# Patient Record
Sex: Male | Born: 1980
Health system: Southern US, Community
[De-identification: ages and names within clinical notes are randomized; demographics above are authoritative.]

## PROBLEM LIST (undated history)

## (undated) DIAGNOSIS — E7800 Pure hypercholesterolemia, unspecified: Secondary | ICD-10-CM

## (undated) DIAGNOSIS — I10 Essential (primary) hypertension: Secondary | ICD-10-CM

## (undated) DIAGNOSIS — E079 Disorder of thyroid, unspecified: Secondary | ICD-10-CM

## (undated) HISTORY — PX: WISDOM TOOTH EXTRACTION: SHX21

---

## 2013-02-15 ENCOUNTER — Encounter (HOSPITAL_COMMUNITY): Payer: Self-pay | Admitting: Family Medicine

## 2013-02-15 ENCOUNTER — Emergency Department (HOSPITAL_COMMUNITY)
Admission: EM | Admit: 2013-02-15 | Discharge: 2013-02-15 | Disposition: A | Discharge status: Home / Self Care | Payer: Self-pay | Attending: Emergency Medicine | Admitting: Emergency Medicine

## 2013-02-15 DIAGNOSIS — Y929 Unspecified place or not applicable: Secondary | ICD-10-CM | POA: Insufficient documentation

## 2013-02-15 DIAGNOSIS — Z23 Encounter for immunization: Secondary | ICD-10-CM | POA: Insufficient documentation

## 2013-02-15 DIAGNOSIS — W260XXA Contact with knife, initial encounter: Secondary | ICD-10-CM | POA: Insufficient documentation

## 2013-02-15 DIAGNOSIS — Y93G9 Activity, other involving cooking and grilling: Secondary | ICD-10-CM | POA: Insufficient documentation

## 2013-02-15 DIAGNOSIS — S61412A Laceration without foreign body of left hand, initial encounter: Secondary | ICD-10-CM

## 2013-02-15 DIAGNOSIS — S61409A Unspecified open wound of unspecified hand, initial encounter: Secondary | ICD-10-CM | POA: Insufficient documentation

## 2013-02-15 DIAGNOSIS — F17200 Nicotine dependence, unspecified, uncomplicated: Secondary | ICD-10-CM | POA: Insufficient documentation

## 2013-02-15 MED ORDER — TETANUS-DIPHTH-ACELL PERTUSSIS 5-2.5-18.5 LF-MCG/0.5 IM SUSP
0.5000 mL | Freq: Once | INTRAMUSCULAR | Status: AC
  Administered 2013-02-15: 0.5 mL via INTRAMUSCULAR
  Filled 2013-02-15: qty 0.5

## 2013-02-15 NOTE — ED Notes (Signed)
Dr. Rulon Abide at bedside to suture laceration.

## 2013-02-15 NOTE — ED Notes (Addendum)
Patient states that he cut his hand on a knife while trying to make a sandwich. Laceration noted to 2nd digit of left hand; bleeding controlled.

## 2013-02-15 NOTE — ED Provider Notes (Signed)
History     CSN: [id number]  Arrival date & time 02/15/13  4098   First MD Initiated Contact with Patient 02/15/13 0423      Chief Complaint  Patient presents with  . Extremity Laceration   HPI Raymond Gomez is a 32 y.o. male a laceration to the left all that begins at the angle of the radial aspect of the second digit extends over the palm or surface about 4 cm. Patient was making a breaded chicken sandwich with mozzarella and mayonnaise when he was cutting bread with a serrated knife and cut into his hand.  Patient's pain at this point is minimal, it is nonradiating, sharp, exacerbated by touching the area. Patient says he is unsure of his last tetanus shot  History reviewed. No pertinent past medical history.  Past Surgical History  Procedure Laterality Date  . Wisdom tooth extraction      No family history on file.  History  Substance Use Topics  . Smoking status: Current Some Day Smoker -- 0.25 packs/day for 25 years  . Smokeless tobacco: Not on file  . Alcohol Use: Yes     Comment: 10-15 drinks per week      Review of Systems At least 10pt or greater review of systems completed and are negative except where specified in the HPI.  Allergies  Review of patient's allergies indicates no known allergies.  Home Medications   Current Outpatient Rx  Name  Route  Sig  Dispense  Refill  . cetirizine (ZYRTEC) 10 MG tablet   Oral   Take 10 mg by mouth daily as needed for allergies.           BP 161/105  Pulse 100  Temp(Src) 98.6 F (37 C) (Oral)  Resp 20  SpO2 97%  Physical Exam  Nursing notes reviewed.  Electronic medical record reviewed. VITAL SIGNS:   Filed Vitals:   02/15/13 0319  BP: 161/105  Pulse: 100  Temp: 98.6 F (37 C)  TempSrc: Oral  Resp: 20  SpO2: 97%   CONSTITUTIONAL: Awake, oriented, appears non-toxic HENT: Atraumatic, normocephalic, oral mucosa pink and moist, airway patent. Nares patent without drainage. External ears  normal. EYES: Conjunctiva clear, EOMI, PERRLA NECK: Trachea midline, non-tender, supple CARDIOVASCULAR: Normal heart rate, Normal rhythm, No murmurs, rubs, gallops PULMONARY/CHEST: Clear to auscultation, no rhonchi, wheezes, or rales. Symmetrical breath sounds. Non-tender. ABDOMINAL: Non-distended, soft, non-tender - no rebound or guarding.  BS normal. NEUROLOGIC: Non-focal, moving all four extremities, no gross sensory or motor deficits. EXTREMITIES: No clubbing, cyanosis, or edema. Laceration to the palmar aspect of the left hand extending from the radial aspect of the second digit across the palm just proximal to the MCP. Fingers appear normal. No TTP over flexor sheath. Radial/Ulnar arteries 2+ with <2sec cap refill. SILT in M/U/R distributions. Dynamic 2-pt discrimination intact over median and ulnar nerve distributions of the pointer finger. Grip 5/5 strength. MCP flexion/extension intact. Finger adduction/abduction intact with 5/5 strength. Full ROM to Flexion/Extension at wrist, MCP, PIP and DIP.  FDS/FDP intact tested independently by isolating specific tendon sheath SKIN: Warm, Dry, No erythema, No rash  ED Course  LACERATION REPAIR Date/Time: 02/15/2013 5:00 AM Performed by: Jones Skene Authorized by: Jones Skene Consent: Verbal consent obtained. Consent given by: patient Patient identity confirmed: verbally with patient Body area: upper extremity (Left palm) Laceration length: 4 cm Foreign bodies: no foreign bodies Tendon involvement: none (Tourniquet applied to the patient's wrist, wound examined under bloodless field, no flexor  tendon involvement) Nerve involvement: none Vascular damage: no Anesthesia: local infiltration Local anesthetic: lidocaine 1% without epinephrine Anesthetic total (ml): 6. Patient sedated: no Preparation: Patient was prepped and draped in the usual sterile fashion. Irrigation solution: saline Irrigation method: syringe (Using 18-gauge  Angiocath) Debridement: none Degree of undermining: none Skin closure: 4-0 nylon Number of sutures: 6 Technique: simple Approximation: close Approximation difficulty: simple Dressing: 4x4 sterile gauze Patient tolerance: Patient tolerated the procedure well with no immediate complications.   (including critical care time)  Labs Reviewed - No data to display No results found.   1. Laceration of palm without complication, left, initial encounter       MDM  Raymond Gomez is a 32 y.o. male presenting with a palmar laceration overlying the flexor tendons second digit of the left hand, Patient is neurovascularly intact.  Repair of laceration uneventfully, patient is neurovascularly intact following procedure.  Patient's tetanus shot was updated, this is not a dirty wound, was not contaminated, it was well irrigated do not think patient will require antibiotics at this time. He is given return precautions for infectious signs at the site to the emergency department, he's told to return in about 9 days to urgent care for suture removal. Patient does not currently have a primary care physician.     Jones Skene, MD 02/15/13 1027

## 2013-02-15 NOTE — ED Notes (Signed)
Bonk, MD at bedside. 

## 2014-09-06 ENCOUNTER — Other Ambulatory Visit: Payer: Self-pay | Admitting: Family Medicine

## 2014-09-06 DIAGNOSIS — K760 Fatty (change of) liver, not elsewhere classified: Secondary | ICD-10-CM

## 2014-09-17 ENCOUNTER — Other Ambulatory Visit: Payer: No Typology Code available for payment source

## 2015-12-19 ENCOUNTER — Emergency Department (HOSPITAL_COMMUNITY)
Admission: EM | Admit: 2015-12-19 | Discharge: 2015-12-20 | Disposition: A | Discharge status: Home / Self Care | Payer: Self-pay | Attending: Emergency Medicine | Admitting: Emergency Medicine

## 2015-12-19 ENCOUNTER — Encounter (HOSPITAL_COMMUNITY): Payer: Self-pay | Admitting: *Deleted

## 2015-12-19 ENCOUNTER — Emergency Department (HOSPITAL_COMMUNITY): Payer: Self-pay

## 2015-12-19 DIAGNOSIS — R202 Paresthesia of skin: Secondary | ICD-10-CM | POA: Insufficient documentation

## 2015-12-19 DIAGNOSIS — I10 Essential (primary) hypertension: Secondary | ICD-10-CM | POA: Insufficient documentation

## 2015-12-19 HISTORY — DX: Pure hypercholesterolemia, unspecified: E78.00

## 2015-12-19 HISTORY — DX: Essential (primary) hypertension: I10

## 2015-12-19 HISTORY — DX: Disorder of thyroid, unspecified: E07.9

## 2015-12-19 LAB — I-STAT CHEM 8, ED
BUN: 8 mg/dL (ref 6–20)
CHLORIDE: 103 mmol/L (ref 101–111)
Calcium, Ion: 1.08 mmol/L — ABNORMAL LOW (ref 1.12–1.23)
Creatinine, Ser: 1.4 mg/dL — ABNORMAL HIGH (ref 0.61–1.24)
Glucose, Bld: 84 mg/dL (ref 65–99)
HCT: 51 % (ref 39.0–52.0)
HEMOGLOBIN: 17.3 g/dL — AB (ref 13.0–17.0)
POTASSIUM: 3.8 mmol/L (ref 3.5–5.1)
SODIUM: 145 mmol/L (ref 135–145)
TCO2: 28 mmol/L (ref 0–100)

## 2015-12-19 LAB — PROTIME-INR
INR: 1.05 (ref 0.00–1.49)
Prothrombin Time: 13.9 seconds (ref 11.6–15.2)

## 2015-12-19 LAB — DIFFERENTIAL
BASOS ABS: 0.1 10*3/uL (ref 0.0–0.1)
BASOS PCT: 1 %
Eosinophils Absolute: 0 10*3/uL (ref 0.0–0.7)
Eosinophils Relative: 0 %
Lymphocytes Relative: 39 %
Lymphs Abs: 4.1 10*3/uL — ABNORMAL HIGH (ref 0.7–4.0)
MONOS PCT: 8 %
Monocytes Absolute: 0.8 10*3/uL (ref 0.1–1.0)
NEUTROS ABS: 5.5 10*3/uL (ref 1.7–7.7)
NEUTROS PCT: 52 %

## 2015-12-19 LAB — I-STAT TROPONIN, ED: Troponin i, poc: 0 ng/mL (ref 0.00–0.08)

## 2015-12-19 LAB — CBG MONITORING, ED: Glucose-Capillary: 83 mg/dL (ref 65–99)

## 2015-12-19 LAB — CBC
HEMATOCRIT: 48 % (ref 39.0–52.0)
Hemoglobin: 16.2 g/dL (ref 13.0–17.0)
MCH: 33.1 pg (ref 26.0–34.0)
MCHC: 33.8 g/dL (ref 30.0–36.0)
MCV: 98.2 fL (ref 78.0–100.0)
PLATELETS: 218 10*3/uL (ref 150–400)
RBC: 4.89 MIL/uL (ref 4.22–5.81)
RDW: 14.7 % (ref 11.5–15.5)
WBC: 10.4 10*3/uL (ref 4.0–10.5)

## 2015-12-19 LAB — COMPREHENSIVE METABOLIC PANEL
ALBUMIN: 4.9 g/dL (ref 3.5–5.0)
ALT: 97 U/L — AB (ref 17–63)
AST: 92 U/L — AB (ref 15–41)
Alkaline Phosphatase: 82 U/L (ref 38–126)
Anion gap: 15 (ref 5–15)
BUN: 10 mg/dL (ref 6–20)
CHLORIDE: 104 mmol/L (ref 101–111)
CO2: 25 mmol/L (ref 22–32)
CREATININE: 0.9 mg/dL (ref 0.61–1.24)
Calcium: 9.4 mg/dL (ref 8.9–10.3)
GFR calc Af Amer: >60 mL/min (ref >60)
GFR calc non Af Amer: >60 mL/min (ref >60)
GLUCOSE: 84 mg/dL (ref 65–99)
Potassium: 3.9 mmol/L (ref 3.5–5.1)
SODIUM: 144 mmol/L (ref 135–145)
Total Bilirubin: 1.1 mg/dL (ref 0.3–1.2)
Total Protein: 8.8 g/dL — ABNORMAL HIGH (ref 6.5–8.1)

## 2015-12-19 LAB — APTT: APTT: 28 s (ref 24–37)

## 2015-12-19 NOTE — ED Notes (Signed)
Patient endorses history of anxiety and a heavy feeling on the chest, but denies pain. Patient states this is consistent with his anxiety and not an acute episode.

## 2015-12-19 NOTE — ED Notes (Signed)
Pt states that he is having left arm and hand numbness since this am; pt denies chest pain; pt with equal grips and strength to bilateral hands; pt able to use arm without difficulty; pt states that it feels like tingling all through his arm; pt + ETOH

## 2015-12-19 NOTE — ED Provider Notes (Signed)
CSN: [id number]     Arrival date & time 12/19/15  1906 History  By signing my name below, I, Tanda RockersMargaux Venter, attest that this documentation has been prepared under the direction and in the presence of Derwood KaplanAnkit Emie Sommerfeld, MD. Electronically Signed: Tanda RockersMargaux Venter, ED Scribe. 12/20/2015. 12:01 AM.   Chief Complaint  Patient presents with  . Numbness   The history is provided by the patient. No language interpreter was used.     HPI Comments: Raymond Gomez is a 35 y.o. male with PMHx HTN who presents to the Emergency Department complaining of constant, unchanged, diffuse left arm tingling that began yesterday morning. Pt describes the tingling as a pins and needle sensation. His last normal was last night prior to going to sleep. No recent head trauma. No hx cardiac issues or stroke. Pt estimates 0-15 beers per day but denies heavy drinking last night or waking up in a strange position. Denies numbness, slurred speech, chest pain, neck pain, or any other associated symptoms. Denies recent illicit drug use.  No recent tick bites. Pt is current smoker who only smokes when he drinks EtOH.  Past Medical History  Diagnosis Date  . Hypertension   . Hypercholesteremia   . Thyroid disease    Past Surgical History  Procedure Laterality Date  . Wisdom tooth extraction     No family history on file. Social History  Substance Use Topics  . Smoking status: Current Every Day Smoker -- 0.50 packs/day for 25 years    Types: Cigarettes  . Smokeless tobacco: None  . Alcohol Use: Yes     Comment: 10-15 drinks per week    Review of Systems  A complete 10 system review of systems was obtained and all systems are negative except as noted in the HPI and PMH.   Allergies  Review of patient's allergies indicates no known allergies.  Home Medications   Prior to Admission medications   Medication Sig Start Date End Date Taking? Authorizing Provider  atorvastatin (LIPITOR) 40 MG tablet Take 40 mg by  mouth daily. 10/21/15  Yes Historical Provider, MD  Cholecalciferol (VITAMIN D3) 5000 units CAPS Take 5,000 Units by mouth daily.   Yes Historical Provider, MD  diphenhydrAMINE (SOMINEX) 25 MG tablet Take 50 mg by mouth at bedtime as needed for itching or sleep (rash).   Yes Historical Provider, MD  levothyroxine (SYNTHROID, LEVOTHROID) 100 MCG tablet Take 100 mcg by mouth daily. 11/28/15  Yes Historical Provider, MD  metoprolol succinate (TOPROL-XL) 50 MG 24 hr tablet Take 50 mg by mouth daily. 12/15/15  Yes Historical Provider, MD  vitamin C (ASCORBIC ACID) 500 MG tablet Take 1,000 mg by mouth daily.   Yes Historical Provider, MD   BP 143/89 mmHg  Pulse 89  Temp(Src) 97.6 F (36.4 C) (Oral)  Resp 16  SpO2 96%   Physical Exam  Constitutional: He is oriented to person, place, and time. He appears well-developed and well-nourished. No distress.  HENT:  Head: Normocephalic and atraumatic.  Eyes: Conjunctivae and EOM are normal.  Neck: Neck supple. No tracheal deviation present.  No C spine point tenderness.   Cardiovascular: Regular rhythm.  Tachycardia present.   No murmur heard. Pulmonary/Chest: Effort normal and breath sounds normal. No respiratory distress. He has no wheezes. He has no rhonchi. He has no rales.  Musculoskeletal: Normal range of motion.  Neurological: He is alert and oriented to person, place, and time.  Subjective paresthesia and numbness to the LUE.  Motor strength  4+/5 and equal in upper and lower extremities.  Pt having difficultly discriminating between sharp and dull over LUE. The lack of sensation is worse distally.   Skin: Skin is warm and dry.  Psychiatric: He has a normal mood and affect. His behavior is normal.  Nursing note and vitals reviewed.   ED Course  Procedures (including critical care time)  DIAGNOSTIC STUDIES: Oxygen Saturation is 96% on RA, normal by my interpretation.    COORDINATION OF CARE: 11:56 PM-Discussed treatment plan which  includes transfer to [facility] for stroke ruleout with pt at bedside and pt agreed to plan.   Labs Review Labs Reviewed  DIFFERENTIAL - Abnormal; Notable for the following:    Lymphs Abs 4.1 (*)    All other components within normal limits  COMPREHENSIVE METABOLIC PANEL - Abnormal; Notable for the following:    Total Protein 8.8 (*)    AST 92 (*)    ALT 97 (*)    All other components within normal limits  I-STAT CHEM 8, ED - Abnormal; Notable for the following:    Creatinine, Ser 1.40 (*)    Calcium, Ion 1.08 (*)    Hemoglobin 17.3 (*)    All other components within normal limits  PROTIME-INR  APTT  CBC  I-STAT TROPOININ, ED  CBG MONITORING, ED    Imaging Review Ct Head Wo Contrast  12/19/2015  CLINICAL DATA:  Left arm and hand numbness since this morning, alcohol use EXAM: CT HEAD WITHOUT CONTRAST TECHNIQUE: Contiguous axial images were obtained from the base of the skull through the vertex without intravenous contrast. COMPARISON:  None. FINDINGS: Mild diffuse cortical atrophy which is nonetheless advanced for the age of the patient. No hydrocephalus. No evidence of vascular territory infarct or mass. No parenchymal hemorrhage or extra-axial fluid. Visualized portions of the paranasal sinuses are clear. Calvarium is intact. IMPRESSION: Atrophy, somewhat advanced for the patient's age. No acute findings. Electronically Signed   By: Esperanza Heir M.D.   On: 12/19/2015 20:24   I have personally reviewed and evaluated these images and lab results as part of my medical decision-making.   EKG Interpretation   Date/Time:  Monday December 19 2015 19:34:44 EDT Ventricular Rate:  98 PR Interval:  157 QRS Duration: 88 QT Interval:  332 QTC Calculation: 424 R Axis:   53 Text Interpretation:  Sinus rhythm normal intervals normal axis No acute  changes Confirmed by Rhunette Croft, MD, Shakeya Kerkman [phone]) on 12/20/2015 12:01:00 AM      MDM   Final diagnoses:  Paresthesia of left arm   I personally  performed the services described in this documentation, which was scribed in my presence. The recorded information has been reviewed and is accurate.  Pt comes in with cc of L sided paresthesias and numbness. Last normal last night.  Hx of heavy alcohol use, HTN, thyroid abnormalities. Uses marijuana now, and has used cocaine in the past. No weakness on the L side - but there is certainly numbness present. No neck pain, shoulder pain.  DDX: Stroke Brachial Plexopathy Cervical nerve impingement.  Pt will be transferred to [facility]. MRI ordered - if neg, he will f/u with Neuro for outpatient nerve conduction studies.     Derwood Kaplan, MD 12/20/15 [phone]

## 2015-12-20 ENCOUNTER — Emergency Department (HOSPITAL_COMMUNITY): Payer: Self-pay

## 2015-12-20 MED ORDER — LORAZEPAM 1 MG PO TABS
1.0000 mg | ORAL_TABLET | Freq: Once | ORAL | Status: AC
  Administered 2015-12-20: 1 mg via ORAL
  Filled 2015-12-20: qty 1

## 2015-12-20 MED ORDER — ACETAMINOPHEN 325 MG PO TABS
650.0000 mg | ORAL_TABLET | Freq: Once | ORAL | Status: AC
  Administered 2015-12-20: 650 mg via ORAL
  Filled 2015-12-20: qty 2

## 2015-12-20 MED ORDER — LORAZEPAM 2 MG/ML IJ SOLN
1.0000 mg | Freq: Once | INTRAMUSCULAR | Status: AC
  Administered 2015-12-20: 1 mg via INTRAVENOUS
  Filled 2015-12-20: qty 1

## 2015-12-20 NOTE — ED Provider Notes (Signed)
Pt stable in the ED Still awaiting MRI Signed out to dr little to f/u on MR imaging   Raymond Rhineonald Rochell Mabie, MD 12/20/15 [phone]

## 2015-12-20 NOTE — ED Notes (Signed)
[facility] per Dr. Rhunette CroftNanavati by private vehicle.

## 2015-12-20 NOTE — ED Notes (Signed)
Called MRI about patient. Stated that he is next on the list.

## 2015-12-20 NOTE — ED Notes (Signed)
RN called MRI regarding est time before patient goes for testing; tech states "just waiting on transport"; patient updated

## 2015-12-20 NOTE — ED Provider Notes (Signed)
Pt awake and alert, here for MRI.  [facility] Pt reports numbness in left arm. MRI pending at this time  Zadie Rhineonald Kadence Mikkelson, MD 12/20/15 [phone]

## 2015-12-20 NOTE — ED Provider Notes (Signed)
Care assumed from Dr. Clarene DukeLittle at 1600h plan for f/u MR for numbness symptom to r/o central etiology.  Results:  BP 177/95 mmHg  Pulse 108  Temp(Src) 98.9 F (37.2 C) (Oral)  Resp 20  Wt 255 lb (115.667 kg)  SpO2 97%  Results for orders placed or performed during the hospital encounter of 12/19/15  Protime-INR  Result Value Ref Range   Prothrombin Time 13.9 11.6 - 15.2 seconds   INR 1.05 0.00 - 1.49  APTT  Result Value Ref Range   aPTT 28 24 - 37 seconds  CBC  Result Value Ref Range   WBC 10.4 4.0 - 10.5 K/uL   RBC 4.89 4.22 - 5.81 MIL/uL   Hemoglobin 16.2 13.0 - 17.0 g/dL   HCT 16.148.0 09.639.0 - 04.552.0 %   MCV 98.2 78.0 - 100.0 fL   MCH 33.1 26.0 - 34.0 pg   MCHC 33.8 30.0 - 36.0 g/dL   RDW 40.914.7 81.111.5 - 91.415.5 %   Platelets 218 150 - 400 K/uL  Differential  Result Value Ref Range   Neutrophils Relative % 52 %   Neutro Abs 5.5 1.7 - 7.7 K/uL   Lymphocytes Relative 39 %   Lymphs Abs 4.1 (H) 0.7 - 4.0 K/uL   Monocytes Relative 8 %   Monocytes Absolute 0.8 0.1 - 1.0 K/uL   Eosinophils Relative 0 %   Eosinophils Absolute 0.0 0.0 - 0.7 K/uL   Basophils Relative 1 %   Basophils Absolute 0.1 0.0 - 0.1 K/uL  Comprehensive metabolic panel  Result Value Ref Range   Sodium 144 135 - 145 mmol/L   Potassium 3.9 3.5 - 5.1 mmol/L   Chloride 104 101 - 111 mmol/L   CO2 25 22 - 32 mmol/L   Glucose, Bld 84 65 - 99 mg/dL   BUN 10 6 - 20 mg/dL   Creatinine, Ser 7.820.90 0.61 - 1.24 mg/dL   Calcium 9.4 8.9 - 95.610.3 mg/dL   Total Protein 8.8 (H) 6.5 - 8.1 g/dL   Albumin 4.9 3.5 - 5.0 g/dL   AST 92 (H) 15 - 41 U/L   ALT 97 (H) 17 - 63 U/L   Alkaline Phosphatase 82 38 - 126 U/L   Total Bilirubin 1.1 0.3 - 1.2 mg/dL   GFR calc non Af Amer >60 >60 mL/min   GFR calc Af Amer >60 >60 mL/min   Anion gap 15 5 - 15  I-stat troponin, ED (not at [facility])  Result Value Ref Range   Troponin i, poc 0.00 0.00 - 0.08 ng/mL   Comment 3          CBG monitoring, ED  Result Value Ref Range   Glucose-Capillary  83 65 - 99 mg/dL   Comment 1 Notify RN    Comment 2 Document in Chart   I-Stat Chem 8, ED  (not at [facility])  Result Value Ref Range   Sodium 145 135 - 145 mmol/L   Potassium 3.8 3.5 - 5.1 mmol/L   Chloride 103 101 - 111 mmol/L   BUN 8 6 - 20 mg/dL   Creatinine, Ser 2.131.40 (H) 0.61 - 1.24 mg/dL   Glucose, Bld 84 65 - 99 mg/dL   Calcium, Ion 0.861.08 (L) 1.12 - 1.23 mmol/L   TCO2 28 0 - 100 mmol/L   Hemoglobin 17.3 (H) 13.0 - 17.0 g/dL   HCT 57.851.0 46.939.0 - 62.952.0 %    Ct Head Wo Contrast  12/19/2015  CLINICAL DATA:  Left arm  and hand numbness since this morning, alcohol use EXAM: CT HEAD WITHOUT CONTRAST TECHNIQUE: Contiguous axial images were obtained from the base of the skull through the vertex without intravenous contrast. COMPARISON:  None. FINDINGS: Mild diffuse cortical atrophy which is nonetheless advanced for the age of the patient. No hydrocephalus. No evidence of vascular territory infarct or mass. No parenchymal hemorrhage or extra-axial fluid. Visualized portions of the paranasal sinuses are clear. Calvarium is intact. IMPRESSION: Atrophy, somewhat advanced for the patient's age. No acute findings. Electronically Signed   By: Esperanza Heir M.D.   On: 12/19/2015 20:24   Mr Brain Wo Contrast  12/20/2015  CLINICAL DATA:  35 year old hypertensive male with high cholesterol presenting with left arm tingling that began yesterday. Subsequent encounter. EXAM: MRI HEAD WITHOUT CONTRAST TECHNIQUE: Multiplanar, multiecho pulse sequences of the brain and surrounding structures were obtained without intravenous contrast. COMPARISON:  12/29/2015 head CT.  No comparison brain MR. FINDINGS: No acute infarct. On diffusion sequence, regions of artifact are noted most notable left cerebellum. No intracranial hemorrhage. No intracranial mass lesion noted on this unenhanced exam. No hydrocephalus. Major intracranial vascular structures are patent. Mild exophthalmos with orbital structures otherwise unremarkable.  Cervical medullary junction within normal limits. There may be a small focal fatty deposit or tiny hemangioma within the clivus. Decreased signal intensity of bone marrow may be related to patient's habitus. Correlation with CBC to exclude anemia contributing to this appearance may be considered Minimal to mild paranasal sinus mucosal thickening. IMPRESSION: No acute infarct or intracranial hemorrhage. No intracranial mass lesion noted on this unenhanced exam. Decreased signal intensity of bone marrow may be related to patient's habitus. Correlation with CBC to exclude anemia contributing to this appearance may be considered Minimal to mild paranasal sinus mucosal thickening. Electronically Signed   By: Lacy Duverney M.D.   On: 12/20/2015 16:43    Radiology and laboratory examinations were reviewed by me and used in medical decision making if performed.   MDM:  MR negative . Plan for follow up with PCP as needed and return precautions discussed for worsening or new concerning symptoms.   Diagnoses that have been ruled out:  None  Diagnoses that are still under consideration:  None  Final diagnoses:  Paresthesia of left arm     Lyndal Pulley, MD 12/20/15 1724

## 2015-12-20 NOTE — ED Notes (Signed)
Charge nurse Elliot GurneyWoody called and made aware of patient's arrival to the department with instructions for MRI.

## 2015-12-20 NOTE — ED Notes (Signed)
Pt verbalized understanding of d/c instructions and has no further questions. Pt stable, neuro intact, NAD.

## 2015-12-20 NOTE — ED Notes (Signed)
Patient transported to MRI 

## 2015-12-20 NOTE — Discharge Instructions (Signed)
Paresthesia °Paresthesia is an abnormal burning or prickling sensation. This sensation is generally felt in the hands, arms, legs, or feet. However, it may occur in any part of the body. Usually, it is not painful. The feeling may be described as: °· Tingling or numbness. °· Pins and needles. °· Skin crawling. °· Buzzing. °· Limbs falling asleep. °· Itching. °Most people experience temporary (transient) paresthesia at some time in their lives. Paresthesia may occur when you breathe too quickly (hyperventilation). It can also occur without any apparent cause. Commonly, paresthesia occurs when pressure is placed on a nerve. The sensation quickly goes away after the pressure is removed. For some people, however, paresthesia is a long-lasting (chronic) condition that is caused by an underlying disorder. If you continue to have paresthesia, you may need further medical evaluation. °HOME CARE INSTRUCTIONS °Watch your condition for any changes. Taking the following actions may help to lessen any discomfort that you are feeling: °· Avoid drinking alcohol. °· Try acupuncture or massage to help relieve your symptoms. °· Keep all follow-up visits as directed by your health care provider. This is important. °SEEK MEDICAL CARE IF: °· You continue to have episodes of paresthesia. °· Your burning or prickling feeling gets worse when you walk. °· You have pain, cramps, or dizziness. °· You develop a rash. °SEEK IMMEDIATE MEDICAL CARE IF: °· You feel weak. °· You have trouble walking or moving. °· You have problems with speech, understanding, or vision. °· You feel confused. °· You cannot control your bladder or bowel movements. °· You have numbness after an injury. °· You faint. °  °This information is not intended to replace advice given to you by your health care provider. Make sure you discuss any questions you have with your health care provider. °  °Document Released: 09/07/2002 Document Revised: 02/01/2015 Document Reviewed:  09/13/2014 °Elsevier Interactive Patient Education ©2016 [employer] ° °Peripheral Neuropathy °Peripheral neuropathy is a type of nerve damage. It affects nerves that carry signals between the spinal cord and other parts of the body. These are called peripheral nerves. With peripheral neuropathy, one nerve or a group of nerves may be damaged.  °CAUSES  °Many things can damage peripheral nerves. For some people with peripheral neuropathy, the cause is unknown. Some causes include: °· Diabetes. This is the most common cause of peripheral neuropathy. °· Injury to a nerve. °· Pressure or stress on a nerve that lasts a long time. °· Too little vitamin B. Alcoholism can lead to this. °· Infections. °· Autoimmune diseases, such as multiple sclerosis and systemic lupus erythematosus. °· Inherited nerve diseases. °· Some medicines, such as cancer drugs. °· Toxic substances, such as lead and mercury. °· Too little blood flowing to the legs. °· Kidney disease. °· Thyroid disease. °SIGNS AND SYMPTOMS  °Different people have different symptoms. The symptoms you have will depend on which of your nerves is damaged.  Common symptoms include: °· Loss of feeling (numbness) in the feet and hands. °· Tingling in the feet and hands. °· Pain that burns. °· Very sensitive skin. °· Weakness. °· Not being able to move a part of the body (paralysis). °· Muscle twitching. °· Clumsiness or poor coordination. °· Loss of balance. °· Not being able to control your bladder. °· Feeling dizzy. °· Sexual problems. °DIAGNOSIS  °Peripheral neuropathy is a symptom, not a disease. Finding the cause of peripheral neuropathy can be hard. To figure that out, your health care provider will take a medical history and do a   physical exam. A neurological exam will also be done. This involves checking things affected by your brain, spinal cord, and nerves (nervous system). For example, your health care provider will check your reflexes, how you move, and what  you can feel.  °Other types of tests may also be ordered, such as: °· Blood tests. °· A test of the fluid in your spinal cord. °· Imaging tests, such as CT scans or an MRI. °· Electromyography (EMG). This test checks the nerves that control muscles. °· Nerve conduction velocity tests. These tests check how fast messages pass through your nerves. °· Nerve biopsy. A small piece of nerve is removed. It is then checked under a microscope. °TREATMENT  °· Medicine is often used to treat peripheral neuropathy. Medicines may include: °¨ Pain-relieving medicines. Prescription or over-the-counter medicine may be suggested. °¨ Antiseizure medicine. This may be used for pain. °¨ Antidepressants. These also may help ease pain from neuropathy. °¨ Lidocaine. This is a numbing medicine. You might wear a patch or be given a shot. °¨ Mexiletine. This medicine is typically used to help control irregular heart rhythms. °· Surgery. Surgery may be needed to relieve pressure on a nerve or to destroy a nerve that is causing pain. °· Physical therapy to help movement. °· Assistive devices to help movement. °HOME CARE INSTRUCTIONS  °· Only take over-the-counter or prescription medicines as directed by your health care provider. Follow the instructions carefully for any given medicines. Do not take any other medicines without first getting approval from your health care provider. °· If you have diabetes, work closely with your health care provider to keep your blood sugar under control. °· If you have numbness in your feet: °¨ Check every day for signs of injury or infection. Watch for redness, warmth, and swelling. °¨ Wear padded socks and comfortable shoes. These help protect your feet. °· Do not do things that put pressure on your damaged nerve. °· Do not smoke. Smoking keeps blood from getting to damaged nerves. °· Avoid or limit alcohol. Too much alcohol can cause a lack of B vitamins. These vitamins are needed for healthy  nerves. °· Develop a good support system. Coping with peripheral neuropathy can be stressful. Talk to a mental health specialist or join a support group if you are struggling. °· Follow up with your health care provider as directed. °SEEK MEDICAL CARE IF:  °· You have new signs or symptoms of peripheral neuropathy. °· You are struggling emotionally from dealing with peripheral neuropathy. °· You have a fever. °SEEK IMMEDIATE MEDICAL CARE IF:  °· You have an injury or infection that is not healing. °· You feel very dizzy or begin vomiting. °· You have chest pain. °· You have trouble breathing. °  °This information is not intended to replace advice given to you by your health care provider. Make sure you discuss any questions you have with your health care provider. °  °Document Released: 09/07/2002 Document Revised: 05/30/2011 Document Reviewed: 05/25/2013 °Elsevier Interactive Patient Education ©2016 [employer] ° °

## 2015-12-20 NOTE — ED Notes (Signed)
Received call from MRI stating that pt was unable to sit to MRI due to feeling claustrophobic. This RN unaware that patient had been taken 3 times prior to 7am for MRI. EDP informed of patient status. IV started and meds ordered to reattempt MRI.

## 2015-12-20 NOTE — ED Notes (Signed)
MRI states patient unable to tolerate scan d/t clostrophobia; verbal order for 1mg  ativan PO by Bebe ShaggyWickline, MD obtained; MRI to scan at a later time when patient can better tolerate

## 2015-12-20 NOTE — ED Notes (Signed)
Spoke with MRI will send for patient 1500 - 1515. Doctor and patient notified.

## 2016-07-14 ENCOUNTER — Encounter (HOSPITAL_COMMUNITY): Payer: Self-pay | Admitting: Nurse Practitioner

## 2016-07-14 ENCOUNTER — Inpatient Hospital Stay (HOSPITAL_COMMUNITY)
Admission: EM | Admit: 2016-07-14 | Discharge: 2016-07-20 | DRG: 896 | Disposition: A | Discharge status: Home / Self Care | Payer: BLUE CROSS/BLUE SHIELD | Attending: Family Medicine | Admitting: Family Medicine

## 2016-07-14 DIAGNOSIS — F10939 Alcohol use, unspecified with withdrawal, unspecified: Secondary | ICD-10-CM | POA: Diagnosis present

## 2016-07-14 DIAGNOSIS — F17210 Nicotine dependence, cigarettes, uncomplicated: Secondary | ICD-10-CM | POA: Diagnosis present

## 2016-07-14 DIAGNOSIS — E871 Hypo-osmolality and hyponatremia: Secondary | ICD-10-CM | POA: Diagnosis present

## 2016-07-14 DIAGNOSIS — R945 Abnormal results of liver function studies: Secondary | ICD-10-CM

## 2016-07-14 DIAGNOSIS — F10231 Alcohol dependence with withdrawal delirium: Principal | ICD-10-CM | POA: Diagnosis present

## 2016-07-14 DIAGNOSIS — E876 Hypokalemia: Secondary | ICD-10-CM | POA: Diagnosis present

## 2016-07-14 DIAGNOSIS — F10232 Alcohol dependence with withdrawal with perceptual disturbance: Secondary | ICD-10-CM

## 2016-07-14 DIAGNOSIS — Z9114 Patient's other noncompliance with medication regimen: Secondary | ICD-10-CM

## 2016-07-14 DIAGNOSIS — Z781 Physical restraint status: Secondary | ICD-10-CM

## 2016-07-14 DIAGNOSIS — E7800 Pure hypercholesterolemia, unspecified: Secondary | ICD-10-CM | POA: Diagnosis present

## 2016-07-14 DIAGNOSIS — G9340 Encephalopathy, unspecified: Secondary | ICD-10-CM | POA: Diagnosis present

## 2016-07-14 DIAGNOSIS — F10932 Alcohol use, unspecified with withdrawal with perceptual disturbance: Secondary | ICD-10-CM

## 2016-07-14 DIAGNOSIS — K7010 Alcoholic hepatitis without ascites: Secondary | ICD-10-CM | POA: Diagnosis present

## 2016-07-14 DIAGNOSIS — D696 Thrombocytopenia, unspecified: Secondary | ICD-10-CM | POA: Diagnosis present

## 2016-07-14 DIAGNOSIS — R319 Hematuria, unspecified: Secondary | ICD-10-CM | POA: Diagnosis present

## 2016-07-14 DIAGNOSIS — R000 Tachycardia, unspecified: Secondary | ICD-10-CM | POA: Diagnosis present

## 2016-07-14 DIAGNOSIS — R7989 Other specified abnormal findings of blood chemistry: Secondary | ICD-10-CM

## 2016-07-14 DIAGNOSIS — J9601 Acute respiratory failure with hypoxia: Secondary | ICD-10-CM | POA: Diagnosis present

## 2016-07-14 DIAGNOSIS — J9600 Acute respiratory failure, unspecified whether with hypoxia or hypercapnia: Secondary | ICD-10-CM

## 2016-07-14 DIAGNOSIS — I10 Essential (primary) hypertension: Secondary | ICD-10-CM | POA: Diagnosis present

## 2016-07-14 DIAGNOSIS — K709 Alcoholic liver disease, unspecified: Secondary | ICD-10-CM | POA: Diagnosis present

## 2016-07-14 DIAGNOSIS — J9811 Atelectasis: Secondary | ICD-10-CM | POA: Diagnosis present

## 2016-07-14 DIAGNOSIS — E039 Hypothyroidism, unspecified: Secondary | ICD-10-CM | POA: Diagnosis present

## 2016-07-14 DIAGNOSIS — F10239 Alcohol dependence with withdrawal, unspecified: Secondary | ICD-10-CM | POA: Diagnosis present

## 2016-07-14 LAB — CBC
HCT: 46.7 % (ref 39.0–52.0)
Hemoglobin: 17.1 g/dL — ABNORMAL HIGH (ref 13.0–17.0)
MCH: 35.6 pg — AB (ref 26.0–34.0)
MCHC: 36.6 g/dL — AB (ref 30.0–36.0)
MCV: 97.1 fL (ref 78.0–100.0)
PLATELETS: 120 10*3/uL — AB (ref 150–400)
RBC: 4.81 MIL/uL (ref 4.22–5.81)
RDW: 13 % (ref 11.5–15.5)
WBC: 9.3 10*3/uL (ref 4.0–10.5)

## 2016-07-14 LAB — COMPREHENSIVE METABOLIC PANEL
ALBUMIN: 4.5 g/dL (ref 3.5–5.0)
ALK PHOS: 115 U/L (ref 38–126)
ALT: 131 U/L — ABNORMAL HIGH (ref 17–63)
AST: 136 U/L — AB (ref 15–41)
Anion gap: 11 (ref 5–15)
BILIRUBIN TOTAL: 4.4 mg/dL — AB (ref 0.3–1.2)
BUN: 15 mg/dL (ref 6–20)
CALCIUM: 10.3 mg/dL (ref 8.9–10.3)
CO2: 23 mmol/L (ref 22–32)
Chloride: 99 mmol/L — ABNORMAL LOW (ref 101–111)
Creatinine, Ser: 0.93 mg/dL (ref 0.61–1.24)
GFR calc Af Amer: >60 mL/min (ref >60)
GLUCOSE: 101 mg/dL — AB (ref 65–99)
Potassium: 3.5 mmol/L (ref 3.5–5.1)
Sodium: 133 mmol/L — ABNORMAL LOW (ref 135–145)
TOTAL PROTEIN: 8.6 g/dL — AB (ref 6.5–8.1)

## 2016-07-14 LAB — RAPID URINE DRUG SCREEN, HOSP PERFORMED
Amphetamines: NOT DETECTED
Barbiturates: NOT DETECTED
Benzodiazepines: POSITIVE — AB
Cocaine: NOT DETECTED
Opiates: NOT DETECTED
Tetrahydrocannabinol: NOT DETECTED

## 2016-07-14 LAB — ETHANOL

## 2016-07-14 LAB — AMMONIA: Ammonia: 54 umol/L — ABNORMAL HIGH (ref 9–35)

## 2016-07-14 MED ORDER — SODIUM CHLORIDE 0.9 % IV BOLUS (SEPSIS)
1000.0000 mL | Freq: Once | INTRAVENOUS | Status: AC
  Administered 2016-07-14: 1000 mL via INTRAVENOUS

## 2016-07-14 MED ORDER — VITAMIN B-1 100 MG PO TABS
100.0000 mg | ORAL_TABLET | Freq: Every day | ORAL | Status: DC
  Administered 2016-07-14: 100 mg via ORAL
  Filled 2016-07-14 (×2): qty 1

## 2016-07-14 MED ORDER — THIAMINE HCL 100 MG/ML IJ SOLN
100.0000 mg | Freq: Every day | INTRAMUSCULAR | Status: DC
  Administered 2016-07-15 – 2016-07-16 (×2): 100 mg via INTRAVENOUS
  Filled 2016-07-14 (×2): qty 2

## 2016-07-14 MED ORDER — FOLIC ACID 5 MG/ML IJ SOLN
1.0000 mg | Freq: Every day | INTRAMUSCULAR | Status: DC
Start: 2016-07-14 — End: 2016-07-16
  Administered 2016-07-14 – 2016-07-15 (×2): 1 mg via INTRAVENOUS
  Filled 2016-07-14 (×3): qty 0.2

## 2016-07-14 MED ORDER — LORAZEPAM 2 MG/ML IJ SOLN: 0.0000 mg | Freq: Two times a day (BID) | INTRAMUSCULAR | Status: DC

## 2016-07-14 MED ORDER — LORAZEPAM 2 MG/ML IJ SOLN
2.0000 mg | Freq: Once | INTRAMUSCULAR | Status: AC
  Administered 2016-07-14: 2 mg via INTRAVENOUS

## 2016-07-14 MED ORDER — HYDROXYZINE HCL 50 MG/ML IM SOLN
100.0000 mg | Freq: Once | INTRAMUSCULAR | Status: AC
  Administered 2016-07-14: 100 mg via INTRAMUSCULAR
  Filled 2016-07-14: qty 2

## 2016-07-14 MED ORDER — LORAZEPAM 2 MG/ML IJ SOLN
0.0000 mg | Freq: Four times a day (QID) | INTRAMUSCULAR | Status: DC
  Administered 2016-07-14 – 2016-07-15 (×2): 2 mg via INTRAVENOUS
  Filled 2016-07-14 (×2): qty 1
  Filled 2016-07-14: qty 2

## 2016-07-14 MED ORDER — LORAZEPAM 2 MG/ML IJ SOLN
4.0000 mg | Freq: Once | INTRAMUSCULAR | Status: AC
  Administered 2016-07-14: 4 mg via INTRAVENOUS
  Filled 2016-07-14: qty 2

## 2016-07-14 MED ORDER — LORAZEPAM 2 MG/ML IJ SOLN
2.0000 mg | Freq: Once | INTRAMUSCULAR | Status: AC
  Administered 2016-07-15: 2 mg via INTRAVENOUS
  Filled 2016-07-14: qty 1

## 2016-07-14 NOTE — ED Triage Notes (Signed)
Pt presents with mild agitation, hand tremors bilaterally and reports of auditory and visual hallucination(states he is hearing and seeing things that family members at bedside state the are not seeing.) Pt reportedly stopped drinking/hard last drink "cold [place]" 6 days ago. Typically he would drink 15-20 shots of hard liquor. [facility] pt to come in for medical clearance prior to intake. Please see my CIWA in the ED narrator.

## 2016-07-14 NOTE — ED Provider Notes (Signed)
[facility]-EMERGENCY DEPT Provider Note   CSN: [id number] Arrival date & time: 07/14/16  1914     History   Chief Complaint Chief Complaint  Patient presents with  . Withdrawal    High CIWA    HPI Raymond Gomez is a 35 y.o. male.  HPI   Raymond Gomez is a 35 year old male with past medical history of hypertension, thyroid disease which he believes his hyperthyroidism, and hypercholesterolemia, currently noncompliant with any medications, presents to emergency department with complaint of alcohol withdrawal with 3-4 days tremor, sweats, mild agitation and auditory and visual hallucinations.  He reports heavy alcohol use over this year estimating 15-20 shots of liquor per day. His last drink was Monday night which is 5 days ago. He went to stay with his parents 4 days ago because he was not feeling well. He has it had intermittent tremors for the past 4 days but they have gradually worsened. He has had auditory visual hallucinations and he reports "disorientation".  The patient and his family was concerned about his symptoms and called [facility] who advised him to come to the ER for medical clearance. The patient reports that they would have a spot for him once he is medically cleared.  The patient's parents are at the bedside and they deny any seizure-like activity or loss of consciousness. Patient denies any fever, nausea, vomiting, diarrhea, syncope, chest pain, shortness of breath.  He "feels shaky" but does not feel off balance, lightheaded or dizzy.    Past Medical History:  Diagnosis Date  . Hypercholesteremia   . Hypertension   . Thyroid disease     There are no active problems to display for this patient.   Past Surgical History:  Procedure Laterality Date  . WISDOM TOOTH EXTRACTION         Home Medications    Prior to Admission medications   Medication Sig Start Date End Date Taking? Authorizing Provider  atorvastatin (LIPITOR) 40 MG tablet Take 40 mg by mouth  daily. 10/21/15   Historical Provider, MD  Cholecalciferol (VITAMIN D3) 5000 units CAPS Take 5,000 Units by mouth daily.    Historical Provider, MD  diphenhydrAMINE (SOMINEX) 25 MG tablet Take 50 mg by mouth at bedtime as needed for itching or sleep (rash).    Historical Provider, MD  levothyroxine (SYNTHROID, LEVOTHROID) 100 MCG tablet Take 100 mcg by mouth daily. 11/28/15   Historical Provider, MD  metoprolol succinate (TOPROL-XL) 50 MG 24 hr tablet Take 50 mg by mouth daily. 12/15/15   Historical Provider, MD  vitamin C (ASCORBIC ACID) 500 MG tablet Take 1,000 mg by mouth daily.    Historical Provider, MD    Family History History reviewed. No pertinent family history.  Social History Social History  Substance Use Topics  . Smoking status: Current Every Day Smoker    Packs/day: 0.50    Years: 25.00    Types: Cigarettes  . Smokeless tobacco: Never Used  . Alcohol use Yes     Comment: 10-15 drinks per week     Allergies   Review of patient's allergies indicates no known allergies.   Review of Systems Review of Systems  Cardiovascular: Negative.   Gastrointestinal: Negative.  Negative for abdominal pain.  Psychiatric/Behavioral: Positive for agitation, confusion, hallucinations and sleep disturbance (unable to sleep). Negative for dysphoric mood, self-injury and suicidal ideas. The patient is nervous/anxious and is hyperactive.   All other systems reviewed and are negative.    Physical Exam Updated Vital Signs  BP (!) 158/101 (BP Location: Right Arm)   Pulse 102   Temp 98.3 F (36.8 C) (Oral)   Resp 20   SpO2 99%   Physical Exam  Constitutional: He is oriented to person, place, and time. He appears well-developed and well-nourished. No distress.  HENT:  Head: Normocephalic and atraumatic.  Right Ear: External ear normal.  Left Ear: External ear normal.  Nose: Nose normal.  Mouth/Throat: Oropharynx is clear and moist. No oropharyngeal exudate.  Eyes: Conjunctivae and  EOM are normal. Pupils are equal, round, and reactive to light. Right eye exhibits no discharge. Left eye exhibits no discharge. No scleral icterus.  Neck: Normal range of motion. Neck supple. No JVD present. No tracheal deviation present.  Cardiovascular: Normal rate, regular rhythm, normal heart sounds and intact distal pulses.  Exam reveals no gallop and no friction rub.   No murmur heard. Pulmonary/Chest: Effort normal and breath sounds normal. No stridor. No respiratory distress.  Abdominal: Soft. Bowel sounds are normal. He exhibits no distension and no mass. There is no tenderness. There is no guarding.  Musculoskeletal: Normal range of motion. He exhibits no edema.  Lymphadenopathy:    He has no cervical adenopathy.  Neurological: He is alert and oriented to person, place, and time. He is not disoriented. He displays tremor. No cranial nerve deficit or sensory deficit. He exhibits normal muscle tone. He displays a negative Romberg sign. He displays no seizure activity. Coordination and gait normal. GCS eye subscore is 4. GCS verbal subscore is 5. GCS motor subscore is 6.  Reflex Scores:      Patellar reflexes are 1+ on the right side and 1+ on the left side. Mental Status:  Alert, oriented, thought content appropriate, able to give a coherent history. Speech fluent without evidence of aphasia, speech somewhat rapid. Able to follow 1 step commands without difficulty.  Cranial Nerves:  II:  Peripheral visual fields grossly normal, pupils equal, round, 4mm reactive to light III,IV, VI: ptosis not present, extra-ocular motions intact bilaterally  V,VII: smile symmetric, facial light touch sensation equal VIII: hearing grossly normal to voice  X: uvula elevates symmetrically  XI: bilateral shoulder shrug symmetric and strong XII: midline tongue extension without fassiculations Motor:  Normal tone. 5/5 in upper and lower extremities bilaterally including strong and equal grip strength and  dorsiflexion/plantar flexion Sensory: Light touch normal in all extremities.  Deep Tendon Reflexes: difficult to obtain with active tremor Cerebellar: normal finger-to-nose with bilateral upper extremities Gait: normal gait and balance CV: distal pulses palpable throughout  Generalized tremor, no asterixis     Skin: Skin is warm. Capillary refill takes less than 2 seconds. No rash noted. He is diaphoretic. No erythema. No pallor.  Psychiatric: He has a normal mood and affect. Judgment and thought content normal. His speech is rapid and/or pressured. He is hyperactive and actively hallucinating. He is not aggressive. He expresses no homicidal and no suicidal ideation. He expresses no suicidal plans and no homicidal plans.  Nursing note and vitals reviewed.    ED Treatments / Results  Labs (all labs ordered are listed, but only abnormal results are displayed) Labs Reviewed  COMPREHENSIVE METABOLIC PANEL - Abnormal; Notable for the following:       Result Value   Sodium 133 (*)    Chloride 99 (*)    Glucose, Bld 101 (*)    Total Protein 8.6 (*)    AST 136 (*)    ALT 131 (*)    Total  Bilirubin 4.4 (*)    All other components within normal limits  CBC - Abnormal; Notable for the following:    Hemoglobin 17.1 (*)    MCH 35.6 (*)    MCHC 36.6 (*)    Platelets 120 (*)    All other components within normal limits  RAPID URINE DRUG SCREEN, HOSP PERFORMED - Abnormal; Notable for the following:    Benzodiazepines POSITIVE (*)    All other components within normal limits  ETHANOL  AMMONIA    EKG  EKG Interpretation None       Radiology No results found.  Procedures Procedures (including critical care time)  Medications Ordered in ED Medications  thiamine (VITAMIN B-1) tablet 100 mg (100 mg Oral Given 07/14/16 2057)    Or  thiamine (B-1) injection 100 mg ( Intravenous See Alternative 07/14/16 2057)  LORazepam (ATIVAN) injection 0-4 mg (2 mg Intravenous Given 07/14/16  2055)    Followed by  LORazepam (ATIVAN) injection 0-4 mg (not administered)  folic acid injection 1 mg (not administered)  sodium chloride 0.9 % bolus 1,000 mL (not administered)     Initial Impression / Assessment and Plan / ED Course  I have reviewed the triage vital signs and the nursing notes.  Pertinent labs & imaging results that were available during my care of the patient were reviewed by me and considered in my medical decision making (see chart for details).  Clinical Course   Pt with alcohol withdrawal x 5 days, Came to the ER for medical clearance evaluation and [facility].   He reports drinking large amounts of alcohol throughout this year. He was able to stop in March of this year for roughly 1 month and did not have any withdrawal symptoms. No history of DTs or seizures.  He stopped drinking after Monday night, after reportedly having daily 15-20 shots of hard liquor, and went to his parents home where he began to have tremor, agitation, difficulty sleeping, sweats, and auditory and visual hallucinations.  At the time of initial evaluation patient was able to articulate clearly the timeline of the last 4-5 days, hallucinations and agitation seemed to become more severe over the past 3 days.  His evaluation he was not actively hallucinating and he was alert and oriented 4.  He was mildly tachycardic with heart rate of 109.  He was seeking treatment, denied SI, HI.  Medical clearance orders were obtained and see what protocol was observed he did have an initial CT was core of 20 and he was given 2 mg of Ativan the first hour he was here. He was given another 2 mg of Ativan the second hour, however he became increasingly disoriented, agitated, became tachycardic and hypertensive.  He was seen in a shared visit with EDP Dr. Effie ShyWentz, who advised increasing dose and the third hour to 6 mg Ativan and 100 mg IM Vistaril.  The patient continued to  become increasingly agitated, combative, disoriented and shows signs of autonomic instability. Dr. Effie ShyWentz consulted critical care for admission - please see his documentation for further details.  Oncoming EDP, Dr. Rhunette CroftNanavati evaluated the pt with me at around 0030 when the pt continued to become combative and more unstable.   Patient was restrained and Ativan was increased.  I called Dr. Vassie LollAlva again recording Precedex dosing, IV was temporarily lost and then Precedex drip was initiated.  The patient continued to be restrained for his safety and the safety of staff  members. Dr. Rhunette Croft titrated precedex up w/o improvement and the pt was subsequently intubated.  Remainder of care was provided by Dr. Rhunette Croft until the pt went to the ICU for admission for alcohol withdrawal/DT's.  Please see his documentation for further details.  Final Clinical Impressions(s) / ED Diagnoses   Final diagnoses:  Alcohol withdrawal syndrome with perceptual disturbance [facility])    New Prescriptions New Prescriptions   No medications on file     Danelle Berry, PA-C 07/21/16 0449    Mancel Bale, MD 07/21/16 [phone]

## 2016-07-14 NOTE — ED Notes (Signed)
RN in triage will start a IV line

## 2016-07-14 NOTE — BH Assessment (Addendum)
Tele Assessment Note   Raymond Gomez is an 35 y.o. male presenting to [facility] requesting detox from alcohol. Pt stated "I'm an alcoholic and I stopped cold [place]". Pt shared that he has been experiencing withdrawal symptoms such as tremors and hallucinations. Pt reported that he has been talking to friends that are not present and thought that one of his friends was stuck in a sleeping bag and he was unable to get her out. Pt reported that he called his father for assistance and his father reminded him that none of his friends were present in the home.  Pt denies SI and HI at this time. No previous suicide attempts or self-injurious behaviors reported. Pt denies depressive symptoms and did not shared any stressors. Pt reported that he has been drinking alcohol daily since March 2017. Pt reported that the longest period of time he's been sober is 3 months. No psychiatric or substance abuse treatment reported. No physical, sexual or emotional abuse reported. No traumatic experiences reported.   Diagnosis: Alcohol Use Disorder, Severe  Past Medical History:  Past Medical History:  Diagnosis Date  . Hypercholesteremia   . Hypertension   . Thyroid disease     Past Surgical History:  Procedure Laterality Date  . WISDOM TOOTH EXTRACTION      Family History: History reviewed. No pertinent family history.  Social History:  reports that he has been smoking Cigarettes.  He has a 12.50 pack-year smoking history. He has never used smokeless tobacco. He reports that he drinks alcohol. He reports that he does not use drugs.  Additional Social History:  Alcohol / Drug Use History of alcohol / drug use?: Yes Longest period of sobriety (when/how long): 3 mos  Substance #1 Name of Substance 1: Alcohol  1 - Age of First Use: 20 1 - Amount (size/oz):  30 shots and  beers  1 - Frequency: daily  1 - Duration: 7 months  1 - Last Use / Amount: 07-08-16  CIWA: CIWA-Ar BP: (!) 158/101 Pulse Rate:  102 Nausea and Vomiting: no nausea and no vomiting Tactile Disturbances: very mild itching, pins and needles, burning or numbness Tremor: six Auditory Disturbances: severe hallucinations Paroxysmal Sweats: two Visual Disturbances: not present Anxiety: moderately anxious, or guarded, so anxiety is inferred Headache, Fullness in Head: none present Agitation: two Orientation and Clouding of Sensorium: oriented and can do serial additions CIWA-Ar Total: 20 COWS:    PATIENT STRENGTHS: (choose at least two) Average or above average intelligence Motivation for treatment/growth  Allergies: No Known Allergies  Home Medications:  (Not in a hospital admission)  OB/GYN Status:  No LMP for male patient.  General Assessment Data Location of Assessment: [facility] ED TTS Assessment: In system Is this a Tele or Face-to-Face Assessment?: Face-to-Face Is this an Initial Assessment or a Re-assessment for this encounter?: Initial Assessment Marital status: Single Living Arrangements: Alone Can pt return to current living arrangement?: Yes Admission Status: Voluntary Is patient capable of signing voluntary admission?: Yes Referral Source: Self/Family/Friend Insurance type: Medcost     Crisis Care Plan Living Arrangements: Alone Name of Psychiatrist: No provider reported.  Name of Therapist: No provider reported.   Education Status Is patient currently in school?: No Highest grade of school patient has completed: Some college  Risk to self with the past 6 months Suicidal Ideation: No Has patient been a risk to self within the past 6 months prior to admission? : No Suicidal Intent: No Has patient had any suicidal intent within  the past 6 months prior to admission? : No Is patient at risk for suicide?: No Suicidal Plan?: No Has patient had any suicidal plan within the past 6 months prior to admission? : No Access to Means: No What has been your use of drugs/alcohol within the last 12  months?: Alcohol and THC use reported.  Previous Attempts/Gestures: No How many times?: 0 Other Self Harm Risks: Pt denies  Triggers for Past Attempts: None known Intentional Self Injurious Behavior: None Family Suicide History: No Recent stressful life event(s):  (No recent stressors reported. d) Persecutory voices/beliefs?: No Depression: No Depression Symptoms:  (Pt denies ) Substance abuse history and/or treatment for substance abuse?: Yes Suicide prevention information given to non-admitted patients: Not applicable  Risk to Others within the past 6 months Homicidal Ideation: No Does patient have any lifetime risk of violence toward others beyond the six months prior to admission? : No Thoughts of Harm to Others: No Current Homicidal Intent: No Current Homicidal Plan: No Access to Homicidal Means: No Identified Victim: N/A History of harm to others?: No Assessment of Violence: None Noted Violent Behavior Description: No violent behaviors observed. Pt is calm and cooperative at this time.  Does patient have access to weapons?: Yes (Comment) (Pt has multiple firearms ) Criminal Charges Pending?: No Does patient have a court date: No Is patient on probation?: No  Psychosis Hallucinations: Auditory, Visual Delusions: None noted  Mental Status Report Appearance/Hygiene: In hospital gown Eye Contact: Good Motor Activity: Tremors Speech: Logical/coherent Level of Consciousness: Alert Mood: Pleasant Affect: Appropriate to circumstance Anxiety Level: Minimal Thought Processes: Coherent, Relevant Judgement: Partial Orientation: Person, Place, Time, Situation Obsessive Compulsive Thoughts/Behaviors: None  Cognitive Functioning Concentration: Normal Memory: Recent Intact, Remote Intact IQ: Average Insight: Good Impulse Control: Fair Appetite: Good Sleep: Decreased Total Hours of Sleep: 2 Vegetative Symptoms: None  ADLScreening [facility] Assessment Services) Patient's  cognitive ability adequate to safely complete daily activities?: Yes Patient able to express need for assistance with ADLs?: Yes Independently performs ADLs?: Yes (appropriate for developmental age)  Prior Inpatient Therapy Prior Inpatient Therapy: No  Prior Outpatient Therapy Prior Outpatient Therapy: No Does patient have an ACCT team?: No Does patient have Intensive In-House Services?  : No Does patient have [facility] services? : No Does patient have P4CC services?: No  ADL Screening (condition at time of admission) Patient's cognitive ability adequate to safely complete daily activities?: Yes Is the patient deaf or have difficulty hearing?: No Does the patient have difficulty seeing, even when wearing glasses/contacts?: No Does the patient have difficulty concentrating, remembering, or making decisions?: No Patient able to express need for assistance with ADLs?: Yes Does the patient have difficulty dressing or bathing?: No Independently performs ADLs?: Yes (appropriate for developmental age) Does the patient have difficulty walking or climbing stairs?: No       Abuse/Neglect Assessment (Assessment to be complete while patient is alone) Physical Abuse: Denies Verbal Abuse: Denies Sexual Abuse: Denies Exploitation of patient/patient's resources: Denies Self-Neglect: Denies     Merchant navy officerAdvance Directives (For Healthcare) Does patient have an advance directive?: No Would patient like information on creating an advanced directive?: No - patient declined information    Additional Information 1:1 In Past 12 Months?: No CIRT Risk: No Elopement Risk: No Does patient have medical clearance?: No (Labs pending)     Disposition: AM Psych eval for final disposition.  Disposition Initial Assessment Completed for this Encounter: Yes Disposition of Patient: Inpatient treatment program Type of inpatient treatment program: Adult  Dariyah Garduno S 07/14/2016 9:24 PM

## 2016-07-14 NOTE — ED Provider Notes (Signed)
  Physical Exam  BP (!) 149/101 (BP Location: Right Arm)   Pulse 116   Temp 98.3 F (36.8 C) (Oral)   Resp 16   SpO2 98%   Physical Exam  ED Course  Procedures  MDM  Possibly DTs or severe alcohol withdrawals. Pt will be admitted to ICU. Last drink - 5-6 days ago.  Benzo ordered. CIWA started.       Derwood KaplanAnkit Ervan Heber, MD 07/14/16 2348

## 2016-07-14 NOTE — ED Provider Notes (Signed)
  Face-to-face evaluation   History: He presents for evaluation of alcohol withdrawal symptoms with agitation, confusion, tremulousness. Last drink was about 5 days ago. He is drinking very heavily, both whiskey and beer. He is trying to be admitted to a local long-term treatment facility.  Physical exam: Alert, agitated, very distracted. He is tachycardic. There is no tachypnea. There is a moderate tremor. Moves all extremities equally.  MDM- signs of significant alcohol withdrawal syndrome, likely DTs, with altered mental status. He will require hospitalization, and will likely be best managed in ICU setting where he can receive Precedex.  23:35- consult critical care- case discussed with Dr. Vassie LollAlva, and he will send someone to admit the patient.  Medical screening examination/treatment/procedure(s) were conducted as a shared visit with non-physician practitioner(s) and myself.  I personally evaluated the patient during the encounter   Mancel BaleElliott Braxston Quinter, MD 07/17/16 1656

## 2016-07-15 ENCOUNTER — Inpatient Hospital Stay (HOSPITAL_COMMUNITY): Payer: BLUE CROSS/BLUE SHIELD

## 2016-07-15 DIAGNOSIS — G9340 Encephalopathy, unspecified: Secondary | ICD-10-CM | POA: Diagnosis not present

## 2016-07-15 DIAGNOSIS — R319 Hematuria, unspecified: Secondary | ICD-10-CM | POA: Diagnosis present

## 2016-07-15 DIAGNOSIS — F10939 Alcohol use, unspecified with withdrawal, unspecified: Secondary | ICD-10-CM | POA: Diagnosis present

## 2016-07-15 DIAGNOSIS — Z781 Physical restraint status: Secondary | ICD-10-CM | POA: Diagnosis not present

## 2016-07-15 DIAGNOSIS — J9600 Acute respiratory failure, unspecified whether with hypoxia or hypercapnia: Secondary | ICD-10-CM | POA: Diagnosis not present

## 2016-07-15 DIAGNOSIS — J9601 Acute respiratory failure with hypoxia: Secondary | ICD-10-CM | POA: Diagnosis present

## 2016-07-15 DIAGNOSIS — E7800 Pure hypercholesterolemia, unspecified: Secondary | ICD-10-CM | POA: Diagnosis present

## 2016-07-15 DIAGNOSIS — E039 Hypothyroidism, unspecified: Secondary | ICD-10-CM | POA: Diagnosis present

## 2016-07-15 DIAGNOSIS — E871 Hypo-osmolality and hyponatremia: Secondary | ICD-10-CM | POA: Diagnosis present

## 2016-07-15 DIAGNOSIS — F10231 Alcohol dependence with withdrawal delirium: Secondary | ICD-10-CM | POA: Diagnosis not present

## 2016-07-15 DIAGNOSIS — J9811 Atelectasis: Secondary | ICD-10-CM | POA: Diagnosis present

## 2016-07-15 DIAGNOSIS — K709 Alcoholic liver disease, unspecified: Secondary | ICD-10-CM | POA: Diagnosis present

## 2016-07-15 DIAGNOSIS — K7010 Alcoholic hepatitis without ascites: Secondary | ICD-10-CM | POA: Diagnosis present

## 2016-07-15 DIAGNOSIS — F10239 Alcohol dependence with withdrawal, unspecified: Secondary | ICD-10-CM | POA: Diagnosis present

## 2016-07-15 DIAGNOSIS — R7989 Other specified abnormal findings of blood chemistry: Secondary | ICD-10-CM | POA: Diagnosis not present

## 2016-07-15 DIAGNOSIS — Z9114 Patient's other noncompliance with medication regimen: Secondary | ICD-10-CM | POA: Diagnosis not present

## 2016-07-15 DIAGNOSIS — Z4682 Encounter for fitting and adjustment of non-vascular catheter: Secondary | ICD-10-CM | POA: Diagnosis not present

## 2016-07-15 DIAGNOSIS — E876 Hypokalemia: Secondary | ICD-10-CM | POA: Diagnosis present

## 2016-07-15 DIAGNOSIS — I10 Essential (primary) hypertension: Secondary | ICD-10-CM | POA: Diagnosis present

## 2016-07-15 DIAGNOSIS — F17210 Nicotine dependence, cigarettes, uncomplicated: Secondary | ICD-10-CM | POA: Diagnosis present

## 2016-07-15 DIAGNOSIS — D696 Thrombocytopenia, unspecified: Secondary | ICD-10-CM | POA: Diagnosis present

## 2016-07-15 DIAGNOSIS — J9690 Respiratory failure, unspecified, unspecified whether with hypoxia or hypercapnia: Secondary | ICD-10-CM | POA: Diagnosis not present

## 2016-07-15 DIAGNOSIS — R000 Tachycardia, unspecified: Secondary | ICD-10-CM | POA: Diagnosis present

## 2016-07-15 LAB — GLUCOSE, CAPILLARY
GLUCOSE-CAPILLARY: 83 mg/dL (ref 65–99)
GLUCOSE-CAPILLARY: 83 mg/dL (ref 65–99)
GLUCOSE-CAPILLARY: 85 mg/dL (ref 65–99)

## 2016-07-15 LAB — BLOOD GAS, ARTERIAL
Acid-base deficit: 4.1 mmol/L — ABNORMAL HIGH (ref 0.0–2.0)
BICARBONATE: 19.4 mmol/L — AB (ref 20.0–28.0)
FIO2: 0.5
LHR: 18 {breaths}/min
MECHVT: 640 mL
O2 Saturation: 97.6 %
PEEP: 5 cmH2O
PO2 ART: 107 mmHg (ref 83.0–108.0)
Patient temperature: 98.6
pCO2 arterial: 32.2 mmHg (ref 32.0–48.0)
pH, Arterial: 7.396 (ref 7.350–7.450)

## 2016-07-15 LAB — CREATININE, SERUM: CREATININE: 1.03 mg/dL (ref 0.61–1.24)

## 2016-07-15 LAB — CBC
HCT: 42 % (ref 39.0–52.0)
Hemoglobin: 15.1 g/dL (ref 13.0–17.0)
MCH: 35.4 pg — ABNORMAL HIGH (ref 26.0–34.0)
MCHC: 36 g/dL (ref 30.0–36.0)
MCV: 98.4 fL (ref 78.0–100.0)
PLATELETS: 123 10*3/uL — AB (ref 150–400)
RBC: 4.27 MIL/uL (ref 4.22–5.81)
RDW: 13.2 % (ref 11.5–15.5)
WBC: 11.9 10*3/uL — ABNORMAL HIGH (ref 4.0–10.5)

## 2016-07-15 LAB — BASIC METABOLIC PANEL
Anion gap: 11 (ref 5–15)
BUN: 15 mg/dL (ref 6–20)
CALCIUM: 9.3 mg/dL (ref 8.9–10.3)
CO2: 20 mmol/L — AB (ref 22–32)
CREATININE: 1.19 mg/dL (ref 0.61–1.24)
Chloride: 105 mmol/L (ref 101–111)
Glucose, Bld: 108 mg/dL — ABNORMAL HIGH (ref 65–99)
Potassium: 3.4 mmol/L — ABNORMAL LOW (ref 3.5–5.1)
SODIUM: 136 mmol/L (ref 135–145)

## 2016-07-15 LAB — PROTIME-INR
INR: 1.03
Prothrombin Time: 13.5 seconds (ref 11.4–15.2)

## 2016-07-15 LAB — TSH: TSH: 3.098 u[IU]/mL (ref 0.350–4.500)

## 2016-07-15 LAB — TRIGLYCERIDES: Triglycerides: 105 mg/dL (ref <150)

## 2016-07-15 LAB — PHOSPHORUS: PHOSPHORUS: 2.8 mg/dL (ref 2.5–4.6)

## 2016-07-15 LAB — MAGNESIUM: MAGNESIUM: 1.4 mg/dL — AB (ref 1.7–2.4)

## 2016-07-15 LAB — MRSA PCR SCREENING: MRSA by PCR: NEGATIVE

## 2016-07-15 MED ORDER — SODIUM CHLORIDE 0.9 % IV BOLUS (SEPSIS)
1000.0000 mL | Freq: Once | INTRAVENOUS | Status: AC
  Administered 2016-07-15: 1000 mL via INTRAVENOUS

## 2016-07-15 MED ORDER — HYDRALAZINE HCL 20 MG/ML IJ SOLN
10.0000 mg | INTRAMUSCULAR | Status: DC | PRN
  Administered 2016-07-15: 10 mg via INTRAVENOUS
  Administered 2016-07-16: 20 mg via INTRAVENOUS
  Filled 2016-07-15 (×2): qty 1

## 2016-07-15 MED ORDER — ENOXAPARIN SODIUM 40 MG/0.4ML ~~LOC~~ SOLN
40.0000 mg | SUBCUTANEOUS | Status: DC
  Administered 2016-07-15 – 2016-07-20 (×6): 40 mg via SUBCUTANEOUS
  Filled 2016-07-15 (×6): qty 0.4

## 2016-07-15 MED ORDER — LORAZEPAM 2 MG/ML IJ SOLN
1.0000 mg | INTRAMUSCULAR | Status: DC | PRN
  Administered 2016-07-15 – 2016-07-17 (×13): 2 mg via INTRAVENOUS
  Filled 2016-07-15 (×14): qty 1

## 2016-07-15 MED ORDER — PANTOPRAZOLE SODIUM 40 MG PO PACK
40.0000 mg | PACK | Freq: Every day | ORAL | Status: DC
  Administered 2016-07-16 – 2016-07-17 (×2): 40 mg
  Filled 2016-07-15 (×2): qty 20

## 2016-07-15 MED ORDER — LORAZEPAM 2 MG/ML IJ SOLN
1.0000 mg/h | INTRAMUSCULAR | Status: DC
  Administered 2016-07-15 (×2): 2 mg/h via INTRAVENOUS
  Filled 2016-07-15 (×2): qty 25

## 2016-07-15 MED ORDER — MIDAZOLAM HCL 2 MG/2ML IJ SOLN
2.0000 mg | INTRAMUSCULAR | Status: DC | PRN
  Administered 2016-07-16: 2 mg via INTRAVENOUS
  Filled 2016-07-15 (×2): qty 2

## 2016-07-15 MED ORDER — MAGNESIUM SULFATE 2 GM/50ML IV SOLN
2.0000 g | Freq: Once | INTRAVENOUS | Status: AC
  Administered 2016-07-15: 2 g via INTRAVENOUS
  Filled 2016-07-15: qty 50

## 2016-07-15 MED ORDER — POTASSIUM CHLORIDE 20 MEQ/15ML (10%) PO SOLN
40.0000 meq | Freq: Once | ORAL | Status: AC
  Administered 2016-07-15: 40 meq
  Filled 2016-07-15: qty 30

## 2016-07-15 MED ORDER — FENTANYL CITRATE (PF) 100 MCG/2ML IJ SOLN
100.0000 ug | INTRAMUSCULAR | Status: DC | PRN
  Filled 2016-07-15 (×4): qty 2

## 2016-07-15 MED ORDER — ORAL CARE MOUTH RINSE
15.0000 mL | Freq: Four times a day (QID) | OROMUCOSAL | Status: DC
  Administered 2016-07-15 – 2016-07-17 (×8): 15 mL via OROMUCOSAL

## 2016-07-15 MED ORDER — MIDAZOLAM HCL 2 MG/2ML IJ SOLN
2.0000 mg | INTRAMUSCULAR | Status: DC | PRN
  Administered 2016-07-15 – 2016-07-16 (×5): 2 mg via INTRAVENOUS
  Filled 2016-07-15 (×5): qty 2

## 2016-07-15 MED ORDER — LABETALOL HCL 5 MG/ML IV SOLN
20.0000 mg | INTRAVENOUS | Status: DC | PRN
  Administered 2016-07-19: 20 mg via INTRAVENOUS
  Filled 2016-07-15 (×2): qty 4

## 2016-07-15 MED ORDER — DEXMEDETOMIDINE HCL IN NACL 200 MCG/50ML IV SOLN
0.2000 ug/kg/h | INTRAVENOUS | Status: AC
  Administered 2016-07-15: 0.4 ug/kg/h via INTRAVENOUS
  Filled 2016-07-15 (×2): qty 50

## 2016-07-15 MED ORDER — LORAZEPAM 2 MG/ML IJ SOLN: 2.0000 mg | Freq: Once | INTRAMUSCULAR | Status: DC

## 2016-07-15 MED ORDER — PROPOFOL 1000 MG/100ML IV EMUL
0.0000 ug/kg/min | INTRAVENOUS | Status: DC
  Administered 2016-07-15 (×2): 50 ug/kg/min via INTRAVENOUS
  Administered 2016-07-15: 48 ug/kg/min via INTRAVENOUS
  Administered 2016-07-15: 20 ug/kg/min via INTRAVENOUS
  Administered 2016-07-15: 30 ug/kg/min via INTRAVENOUS
  Administered 2016-07-15: 45 ug/kg/min via INTRAVENOUS
  Administered 2016-07-15: 35.436 ug/kg/min via INTRAVENOUS
  Administered 2016-07-16 (×4): 50 ug/kg/min via INTRAVENOUS
  Filled 2016-07-15: qty 200
  Filled 2016-07-15 (×3): qty 100
  Filled 2016-07-15: qty 200
  Filled 2016-07-15 (×4): qty 100

## 2016-07-15 MED ORDER — PROPOFOL 1000 MG/100ML IV EMUL
INTRAVENOUS | Status: AC
  Filled 2016-07-15: qty 100

## 2016-07-15 MED ORDER — ETOMIDATE 2 MG/ML IV SOLN
20.0000 mg | Freq: Once | INTRAVENOUS | Status: AC
  Administered 2016-07-15: 20 mg via INTRAVENOUS

## 2016-07-15 MED ORDER — FENTANYL CITRATE (PF) 100 MCG/2ML IJ SOLN
100.0000 ug | INTRAMUSCULAR | Status: DC | PRN
  Administered 2016-07-15 – 2016-07-17 (×6): 100 ug via INTRAVENOUS
  Filled 2016-07-15 (×4): qty 2

## 2016-07-15 MED ORDER — LORAZEPAM 2 MG/ML IJ SOLN
INTRAMUSCULAR | Status: AC
  Administered 2016-07-15: 2 mg
  Filled 2016-07-15: qty 2

## 2016-07-15 MED ORDER — ADULT MULTIVITAMIN W/MINERALS CH: 1.0000 | ORAL_TABLET | Freq: Every day | ORAL | Status: DC

## 2016-07-15 MED ORDER — SODIUM CHLORIDE 0.9 % IV SOLN: 250.0000 mL | INTRAVENOUS | Status: DC | PRN

## 2016-07-15 MED ORDER — ROCURONIUM BROMIDE 50 MG/5ML IV SOLN
100.0000 mg | Freq: Once | INTRAVENOUS | Status: AC
  Administered 2016-07-15: 100 mg via INTRAVENOUS
  Filled 2016-07-15: qty 10

## 2016-07-15 MED ORDER — CHLORHEXIDINE GLUCONATE 0.12% ORAL RINSE (MEDLINE KIT)
15.0000 mL | Freq: Two times a day (BID) | OROMUCOSAL | Status: DC
  Administered 2016-07-15 – 2016-07-17 (×6): 15 mL via OROMUCOSAL

## 2016-07-15 MED ORDER — ASPIRIN 300 MG RE SUPP: 300.0000 mg | RECTAL | Status: DC

## 2016-07-15 MED ORDER — ADULT MULTIVITAMIN LIQUID CH
15.0000 mL | Freq: Every day | ORAL | Status: DC
  Administered 2016-07-16: 15 mL
  Filled 2016-07-15 (×4): qty 15

## 2016-07-15 MED ORDER — MIDAZOLAM HCL 2 MG/2ML IJ SOLN: 5.0000 mg | INTRAMUSCULAR | Status: DC | PRN

## 2016-07-15 MED ORDER — ASPIRIN 81 MG PO CHEW: 324.0000 mg | CHEWABLE_TABLET | ORAL | Status: DC

## 2016-07-15 NOTE — ED Provider Notes (Addendum)
  Physical Exam  BP 130/98   Pulse 116   Temp 98.3 F (36.8 C) (Oral)   Resp (!) 28   Wt 255 lb 1.2 oz (115.7 kg)   SpO2 98%   Physical Exam  ED Course  Procedures  MDM   Pt was awaiting admission to ICU for alcohol withdrawals and DTs. Pt suddenly started screaming and needed restraints. We had 6 staff member restraint him for several minutes and pt received 14 mg (or possibly more) of ativan prn doses w/o any effects. I reassessed the patient multiple times. Precedex infusion started and was maxed out at 0.7 mcg/kg/hr. We went to 1 mcg/kg/hr w/o significant change. Pt severely diaphoretic and tachycardic for extended period of time Decision to intubate was made for the safety of the patient and our staff.  Ativan prn ordered as a drip PRN - as I wouldn't be surprised if patient will be agitated again once the paralytic wear off.  INTUBATION Performed by: Derwood KaplanNanavati, Lamine Laton  Required items: required blood products, implants, devices, and special equipment available Patient identity confirmed: provided demographic data and hospital-assigned identification number Time out: Immediately prior to procedure a "time out" was called to verify the correct patient, procedure, equipment, support staff and site/side marked as required.  Indications: SAFETY of the patient-  Gain airway control  Intubation method: Glidescope Laryngoscopy   Preoxygenation: BVM  Sedatives: 20 mg Etomidate Paralytic: 100 mg Rocuronium  Tube Size: 8 cuffed  Post-procedure assessment: chest rise and ETCO2 monitor Breath sounds: equal and absent over the epigastrium Tube secured with: ETT holder Chest x-ray interpreted by radiologist and me.  Chest x-ray findings: endotracheal tube in appropriate position  Patient tolerated the procedure well with no immediate complications.   CRITICAL CARE Performed by: Derwood KaplanNanavati, Lawonda Pretlow   Total critical care time: 30 minutes  Critical care time was exclusive of  separately billable procedures and treating other patients.  Critical care was necessary to treat or prevent imminent or life-threatening deterioration.  Critical care was time spent personally by me on the following activities: development of treatment plan with patient and/or surrogate as well as nursing, discussions with consultants, evaluation of patient's response to treatment, examination of patient, obtaining history from patient or surrogate, ordering and performing treatments and interventions, ordering and review of laboratory studies, ordering and review of radiographic studies, pulse oximetry and re-evaluation of patient's condition.        Derwood KaplanAnkit Yaretsi Humphres, MD 07/15/16 [id number]    Derwood KaplanAnkit Shree Espey, MD 07/15/16 [id number]

## 2016-07-15 NOTE — Progress Notes (Signed)
Pt transported to ICU room 1225.  Pt and airway remained stable throughout transport.

## 2016-07-15 NOTE — ED Notes (Signed)
Pt.became more agitated , jumped out of bed, pulled lines and tubings, confused and very restless. MD notified, security called . Medicated per verbal order of Dr. Rhunette CroftNanavati for additional 4mg  IV ativan. Pt. Pulled IV line.

## 2016-07-15 NOTE — H&P (Signed)
PULMONARY / CRITICAL CARE MEDICINE   Name: Raymond Gomez MRN: [id number] DOB: 1981-06-12    ADMISSION DATE:  07/14/2016 CONSULTATION DATE:    REFERRING MD:  EDP  CHIEF COMPLAINT:  Shaking, confusion  HISTORY OF PRESENT ILLNESS:   Raymond Gomez is a 89M with PMH significant for hypertension, hypothyroidism and alcohol abuse/dependence who presents with acute withdrawal symptoms and desire to detox. He has a long history of problems with alcohol and was able to quit last January for about a month without any major complications. Over the last 6+ months he has gone back to drinking heavily - mostly hard liquor (Fireball), but also beer - 30+ oz daily. He has stopped attending to his other medical issues during this time as well, and has been non-adherent to therapy for thyroid disease and hypertension. He decided to quit drinking on 10/8. He reportedly has not had any alcohol since that day. Over the last 48h he has developed worsening tremor, agitation, and hallucinations (auditory and visual). His parents finally convinced him to come to the ED for evaluation before going to an inpatient rehab. He is currently agitated and confused and thinks multiple other individuals are in the exam room. He think he has been in a plane crash and survived jumping out of the plane. He is able to tell me that it is 2017 and October, but reports his age as [age]. He was able to recognize his mother when asked, but mis-identified his father.   PAST MEDICAL HISTORY :  He  has a past medical history of Hypercholesteremia; Hypertension; and Thyroid disease.  PAST SURGICAL HISTORY: He  has a past surgical history that includes Wisdom tooth extraction.  No Known Allergies  No current facility-administered medications on file prior to encounter.    Current Outpatient Prescriptions on File Prior to Encounter  Medication Sig  . atorvastatin (LIPITOR) 40 MG tablet Take 40 mg by mouth daily.  . Cholecalciferol (VITAMIN  D3) 5000 units CAPS Take 5,000 Units by mouth daily.  . diphenhydrAMINE (SOMINEX) 25 MG tablet Take 50 mg by mouth at bedtime as needed for itching or sleep (rash).  Marland Kitchen. levothyroxine (SYNTHROID, LEVOTHROID) 100 MCG tablet Take 100 mcg by mouth daily.  . metoprolol succinate (TOPROL-XL) 50 MG 24 hr tablet Take 50 mg by mouth daily.  . vitamin C (ASCORBIC ACID) 500 MG tablet Take 1,000 mg by mouth daily.    FAMILY HISTORY:  His has no family status information on file.  No significant family history - both parents are generally healthy.   SOCIAL HISTORY: He  reports that he has been smoking Cigarettes.  He has a 12.50 pack-year smoking history. He has never used smokeless tobacco. He reports that he drinks alcohol. He reports that he does not use drugs.  REVIEW OF SYSTEMS:   Unable to obtain full ROS 2/2 mental status, but he does deny fevers / chills / cough / sputum / abdominal pain / urinary issues / sores / ulcers / bleeding issues.   SUBJECTIVE:    VITAL SIGNS: BP 130/98   Pulse 116   Temp 98.3 F (36.8 C) (Oral)   Resp (!) 28   SpO2 98%   HEMODYNAMICS:    VENTILATOR SETTINGS:    INTAKE / OUTPUT: No intake/output data recorded.  PHYSICAL EXAMINATION:  General Well nourished, well developed, moderate distress, tremulous, flushed, agitated  HEENT No gross abnormalities. Oropharynx clear. Mallampati II. Good dentition.   Pulmonary Clear to auscultation bilaterally with no wheezes,  rales or ronchi. Good effort, symmetrical expansion.   Cardiovascular Normal rate, regular rhythm. S1, s2. No m/r/g. Distal pulses palpable.  Abdomen Soft, non-tender, non-distended, positive bowel sounds, no palpable organomegaly or masses. Normoresonant to percussion.  Musculoskeletal Grossly normal. No bony deformities  Lymphatics No cervical, supraclavicular or axillary adenopathy.   Neurologic Grossly intact. No focal deficits.   Skin/Integuement No rash, no cyanosis, no clubbing.       LABS:  BMET  Recent Labs Lab 07/14/16 1957  NA 133*  K 3.5  CL 99*  CO2 23  BUN 15  CREATININE 0.93  GLUCOSE 101*    Electrolytes  Recent Labs Lab 07/14/16 1957  CALCIUM 10.3    CBC  Recent Labs Lab 07/14/16 1957  WBC 9.3  HGB 17.1*  HCT 46.7  PLT 120*    Coag's No results for input(s): APTT, INR in the last 168 hours.  Sepsis Markers No results for input(s): LATICACIDVEN, PROCALCITON, O2SATVEN in the last 168 hours.  ABG No results for input(s): PHART, PCO2ART, PO2ART in the last 168 hours.  Liver Enzymes  Recent Labs Lab 07/14/16 1957  AST 136*  ALT 131*  ALKPHOS 115  BILITOT 4.4*  ALBUMIN 4.5    Cardiac Enzymes No results for input(s): TROPONINI, PROBNP in the last 168 hours.  Glucose No results for input(s): GLUCAP in the last 168 hours.  Imaging No results found.   STUDIES:    CULTURES: none  ANTIBIOTICS: none  SIGNIFICANT EVENTS:   LINES/TUBES: PIV  DISCUSSION: Raymond Gomez is a 15M who presents with acute alcohol withdrawal with visual and auditory hallucinations, tremors, hypertension and significant agitation. His PMH is significant only for hypothyroidism and hypertension. He is interested in getting sober.   ASSESSMENT / PLAN: NEUROLOGIC A:   Acute encephalopathy / delirium tremens 2/2 alcohol withdrawal P:   RASS goal: 0 to -2 Precedex gtt Thiamine, folate, MVI Prn benzos as needed Prn haldol as needed  GASTROINTESTINAL A:   Hepatic dysfunction - AST/ALT/Tbili elevations - likely evolving alcoholic liver disease P:   Check INR Check acute hepatitis panel Trend liver enzymes Check RUQ u/s NPO while mental status poor  CARDIOVASCULAR A:  Tachycardia - likely withdrawal mediated Hypertension P:  Treat underlying disorder PRN labetalol and/or hydralazine for SBP > 180  PULMONARY A: No acute issues P:    RENAL A:   No acute issues P:     HEMATOLOGIC A:   Thrombocytopenia - likely  related to hepatic dysfunction P:  Trend CBC Check INR  INFECTIOUS A:   No acute issues P:     ENDOCRINE A:   Hx Hypothroidism, reportedly non-compliant with meds   P:   Check TSH Monitor CBG   FAMILY  - Updates: Parents at bedside  - Inter-disciplinary family meet or Palliative Care meeting due by:  day 7  The patient is critically ill with multiple organ system failure and requires high complexity decision making for assessment and support, frequent evaluation and titration of therapies, advanced monitoring, review of radiographic studies and interpretation of complex data.   Critical Care Time devoted to patient care services, exclusive of separately billable procedures, described in this note is 37 minutes.   Nita Sickle, MD Pulmonary and Critical Care Medicine [facility] Pager: [phone]  07/15/2016, 12:30 AM

## 2016-07-15 NOTE — Progress Notes (Signed)
[facility]  S: agitation overnight  O: Vitals:   07/15/16 0600 07/15/16 0700 07/15/16 0800 07/15/16 0900  BP: (!) 95/56 (!) 97/57 103/65 (!) 166/112  Pulse: 69 76 76 (!) 104  Resp: 18 18 18  (!) 22  Temp:   97.9 F (36.6 C)   TempSrc:   Oral   SpO2: 100% 100% 100% 100%  Weight:      Height:       Vent Mode: PRVC FiO2 (%):  [70 %] 70 % Set Rate:  [18 bmp] 18 bmp Vt Set:  [640 mL] 640 mL PEEP:  [5 cmH20] 5 cmH20 Plateau Pressure:  [17 cmH20-18 cmH20] 18 cmH20 e  Gen: sedated on vent HENT: NCAT ETT in place PULM: CTA B, Ventilator supported breaths CV: RRR, no mgr, trace edema GI: BS+, soft, nontender Derm: no cyanosis or rash Neurologic: Heavily sedated   Labs this morning reviewed significant for hypokalemia and mild thrombocytopenia, white blood cell count 11.9  Impression: Acute encephalopathy Alcohol withdrawal with delirium tremens Alcoholic hepatitis Acute respiratory failure with hypoxemia  Plan: Follow-up right upper quadrant ultrasound Repeat LFT Continue full ventilator support Check ABG considering high ventilator settings Wean FiO2 and PEEP as needed to maintain O2 saturation greater than 90%  Additional critical care time 30 minutes  Heber CarolinaBrent Samyria Rudie, MD [facility] Pager: [phone] Cell: [phone] After 3pm or if no response, call [phone]

## 2016-07-15 NOTE — Progress Notes (Signed)
NG tube advanced 6cm per Dr. Vassie LollAlva verbal order.

## 2016-07-15 NOTE — Progress Notes (Signed)
Initial Nutrition Assessment  DOCUMENTATION CODES:   Obesity unspecified  INTERVENTION:   If patient expected to remain intubated for >48 hours, recommend nutrition support.  Tube feeding recommendations: 60 ml Prostat 5 times daily.   Tube feeding regimen + current Propofol infusion rate provides 1649 kcal (62% of needs), 150 grams of protein, and 300 ml of H2O.   RD to continue to monitor for plan  NUTRITION DIAGNOSIS:   Inadequate oral intake related to inability to eat as evidenced by NPO status.  GOAL:   Patient will meet greater than or equal to 90% of their needs  MONITOR:   Vent status, Labs, Weight trends, I & O's  REASON FOR ASSESSMENT:   Ventilator    ASSESSMENT:   72M with PMH significant for hypertension, hypothyroidism and alcohol abuse/dependence who presents with acute withdrawal symptoms and desire to detox. He has a long history of problems with alcohol and was able to quit last January for about a month without any major complications. Over the last 6+ months he has gone back to drinking heavily - mostly hard liquor (Fireball), but also beer - 30+ oz daily. He has stopped attending to his other medical issues during this time as well, and has been non-adherent to therapy for thyroid disease and hypertension. He decided to quit drinking on 10/8. He reportedly has not had any alcohol since that day.   Patient in room with no family at bedside. Per RN, pt is not expected to extubate today and tube feedings are not planned to start. Pt awaiting abdominal ultrasound.  Nutrition focused physical exam shows no sign of depletion of muscle mass or body fat.  Patient is currently intubated on ventilator support MV: 11.5 L/min Temp (24hrs), Avg:99 F (37.2 C), Min:97.9 F (36.6 C), Max:100.9 F (38.3 C)  Propofol: 24.6 ml/hr- provides 649 fat kcal. *Recommendations provided above are unable to meet protein needs at this time given current Propofol infusion rate.  Will monitor Propofol infusion and will adjust tube feeding recommendations accordingly.  Medications: IV folic acid daily, Liquid multivitamin daily,  IV Thiamine daily, Precedex Labs reviewed: Low K, Mg Phos WNL  Diet Order:  Diet NPO time specified  Skin:  Reviewed, no issues  Last BM:  PTA  Height:   Ht Readings from Last 1 Encounters:  07/15/16 6\' 2"  (1.88 m)    Weight:   Wt Readings from Last 1 Encounters:  07/15/16 258 lb 9.6 oz (117.3 kg)    Ideal Body Weight:  86.4 kg  BMI:  Body mass index is 33.2 kg/m.  Estimated Nutritional Needs:   Kcal:  [id number]  Protein:  160-170g  Fluid:  1.5L/day  EDUCATION NEEDS:   No education needs identified at this time  Raymond FrancoLindsey Kynzie Polgar, MS, RD, LDN Pager: [phone] After Hours Pager: [phone]

## 2016-07-15 NOTE — ED Notes (Signed)
Pt. Still agitated , restless . To be intubated by Dr. Rhunette CroftNanavati. RT ,Charge Nurse at bedside. Security and male techs at bedside. Pt. Placed on restraints for safety.

## 2016-07-15 NOTE — Progress Notes (Addendum)
eLink Physician-Brief Progress Note Patient Name: Raymond Gomez DOB: [birth year] MRN: [id number]   Date of Service  07/15/2016  HPI/Events of Note  Severe DTs, now intubated , arrives in ICU on precedex + ativan gtt  eICU Interventions  Start propofol gtt, wean precedex & ativan gtt to off Use ativan prn in addition Chk Mg/phos Advance NG 6 cm Protonix     Intervention Category Major Interventions: Delirium, psychosis, severe agitation - evaluation and management Evaluation Type: New Patient Evaluation  Horst Ostermiller V. 07/15/2016, 2:52 AM

## 2016-07-15 NOTE — Progress Notes (Signed)
Dr. Vassie LollAlva placed orders for bilateral, non violent wrist restraints received when patient arrived on unit at 0251.  ED physician wrote for violent wrist restraints at 0445. Patient did not meet the criteria for violent wrist restraints at that time.  Dr. Vassie LollAlva notified and verbal order to continue with bilateral non violent wrist restraints received.

## 2016-07-15 NOTE — Progress Notes (Signed)
40cc ativan wasted in sink with Fredderick SeveranceQueeneth M RN .

## 2016-07-15 NOTE — ED Notes (Signed)
Intensivist  At bedside.  

## 2016-07-15 NOTE — ED Notes (Signed)
PT continues to attempt to get out of bed with multiple staff members attempting to restrain pt even with restraints in place. Nursing Supervisor at bedside. Admitting MD notified.

## 2016-07-15 NOTE — ED Notes (Signed)
Pt coming increasing combative and agitated attempting to hit staff. Dr.Nanavati at bedside with pt placed into restraints.

## 2016-07-16 ENCOUNTER — Inpatient Hospital Stay (HOSPITAL_COMMUNITY): Payer: BLUE CROSS/BLUE SHIELD

## 2016-07-16 DIAGNOSIS — J9601 Acute respiratory failure with hypoxia: Secondary | ICD-10-CM

## 2016-07-16 DIAGNOSIS — F10231 Alcohol dependence with withdrawal delirium: Principal | ICD-10-CM

## 2016-07-16 LAB — HEPATIC FUNCTION PANEL
ALT: 125 U/L — ABNORMAL HIGH (ref 17–63)
AST: 201 U/L — ABNORMAL HIGH (ref 15–41)
Albumin: 3.9 g/dL (ref 3.5–5.0)
Alkaline Phosphatase: 88 U/L (ref 38–126)
BILIRUBIN TOTAL: 3.9 mg/dL — AB (ref 0.3–1.2)
Bilirubin, Direct: 1.8 mg/dL — ABNORMAL HIGH (ref 0.1–0.5)
Indirect Bilirubin: 2.1 mg/dL — ABNORMAL HIGH (ref 0.3–0.9)
Total Protein: 7.7 g/dL (ref 6.5–8.1)

## 2016-07-16 LAB — BLOOD GAS, ARTERIAL
Acid-base deficit: 4.6 mmol/L — ABNORMAL HIGH (ref 0.0–2.0)
BICARBONATE: 19.3 mmol/L — AB (ref 20.0–28.0)
Drawn by: 44138
FIO2: 40
O2 Saturation: 99.3 %
PATIENT TEMPERATURE: 98.6
PCO2 ART: 34.1 mmHg (ref 32.0–48.0)
PEEP: 5 cmH2O
PH ART: 7.371 (ref 7.350–7.450)
RATE: 18 resp/min
VT: 640 mL
pO2, Arterial: 184 mmHg — ABNORMAL HIGH (ref 83.0–108.0)

## 2016-07-16 LAB — BASIC METABOLIC PANEL
Anion gap: 12 (ref 5–15)
BUN: 12 mg/dL (ref 6–20)
CALCIUM: 9.3 mg/dL (ref 8.9–10.3)
CO2: 21 mmol/L — ABNORMAL LOW (ref 22–32)
CREATININE: 0.84 mg/dL (ref 0.61–1.24)
Chloride: 106 mmol/L (ref 101–111)
GFR calc non Af Amer: >60 mL/min (ref >60)
Glucose, Bld: 74 mg/dL (ref 65–99)
Potassium: 3.9 mmol/L (ref 3.5–5.1)
SODIUM: 139 mmol/L (ref 135–145)

## 2016-07-16 LAB — GLUCOSE, CAPILLARY
GLUCOSE-CAPILLARY: 72 mg/dL (ref 65–99)
GLUCOSE-CAPILLARY: 130 mg/dL — AB (ref 65–99)
GLUCOSE-CAPILLARY: 111 mg/dL — AB (ref 65–99)
GLUCOSE-CAPILLARY: 102 mg/dL — AB (ref 65–99)
Glucose-Capillary: 100 mg/dL — ABNORMAL HIGH (ref 65–99)

## 2016-07-16 LAB — HEPATITIS PANEL, ACUTE
HEP A IGM: NEGATIVE
HEP B S AG: NEGATIVE
Hep B C IgM: NEGATIVE

## 2016-07-16 LAB — CBC
HCT: 45.7 % (ref 39.0–52.0)
Hemoglobin: 16.5 g/dL (ref 13.0–17.0)
MCH: 35.7 pg — AB (ref 26.0–34.0)
MCHC: 36.1 g/dL — ABNORMAL HIGH (ref 30.0–36.0)
MCV: 98.9 fL (ref 78.0–100.0)
PLATELETS: 107 10*3/uL — AB (ref 150–400)
RBC: 4.62 MIL/uL (ref 4.22–5.81)
RDW: 13.4 % (ref 11.5–15.5)
WBC: 10.6 10*3/uL — ABNORMAL HIGH (ref 4.0–10.5)

## 2016-07-16 LAB — MAGNESIUM
Magnesium: 2.1 mg/dL (ref 1.7–2.4)
Magnesium: 2.2 mg/dL (ref 1.7–2.4)

## 2016-07-16 LAB — PHOSPHORUS
Phosphorus: 2.4 mg/dL — ABNORMAL LOW (ref 2.5–4.6)
Phosphorus: 3.1 mg/dL (ref 2.5–4.6)

## 2016-07-16 MED ORDER — PRO-STAT SUGAR FREE PO LIQD: 30.0000 mL | Freq: Two times a day (BID) | ORAL | Status: DC

## 2016-07-16 MED ORDER — PRO-STAT SUGAR FREE PO LIQD
60.0000 mL | Freq: Four times a day (QID) | ORAL | Status: DC
  Administered 2016-07-16 (×3): 60 mL
  Filled 2016-07-16 (×4): qty 60

## 2016-07-16 MED ORDER — VITAL HIGH PROTEIN PO LIQD
1000.0000 mL | ORAL | Status: DC
  Filled 2016-07-16: qty 1000

## 2016-07-16 MED ORDER — DEXMEDETOMIDINE HCL IN NACL 200 MCG/50ML IV SOLN
0.4000 ug/kg/h | INTRAVENOUS | Status: AC
  Administered 2016-07-16: 0.6 ug/kg/h via INTRAVENOUS
  Administered 2016-07-16: 0.4 ug/kg/h via INTRAVENOUS
  Administered 2016-07-16 (×2): 0.7 ug/kg/h via INTRAVENOUS
  Administered 2016-07-16: 0.6 ug/kg/h via INTRAVENOUS
  Administered 2016-07-17 (×2): 0.8 ug/kg/h via INTRAVENOUS
  Administered 2016-07-17: 0.5 ug/kg/h via INTRAVENOUS
  Administered 2016-07-17: 0.7 ug/kg/h via INTRAVENOUS
  Filled 2016-07-16 (×11): qty 50

## 2016-07-16 MED ORDER — FOLIC ACID 1 MG PO TABS
1.0000 mg | ORAL_TABLET | Freq: Every day | ORAL | Status: DC
  Administered 2016-07-16 – 2016-07-20 (×5): 1 mg via ORAL
  Filled 2016-07-16 (×5): qty 1

## 2016-07-16 MED ORDER — CLONAZEPAM 0.5 MG PO TABS
0.5000 mg | ORAL_TABLET | Freq: Two times a day (BID) | ORAL | Status: DC
  Administered 2016-07-16 – 2016-07-18 (×5): 0.5 mg
  Filled 2016-07-16 (×5): qty 1

## 2016-07-16 NOTE — Progress Notes (Signed)
Nutrition Follow-up  DOCUMENTATION CODES:   Obesity unspecified  INTERVENTION:  - Recommend 60 mL Prostat QID. This regimen + kcal from Propofol will provide 1729 kcal (105% maximum estimated kcal need), 120 grams of protein (77% minimum estimated protein need).  - RD will follow-up 10/18.  NUTRITION DIAGNOSIS:   Inadequate oral intake related to inability to eat as evidenced by NPO status. -ongoing  GOAL:   Patient will meet greater than or equal to 90% of their needs -unmet  MONITOR:   Vent status, Labs, Weight trends, I & O's  ASSESSMENT:   89M with PMH significant for hypertension, hypothyroidism and alcohol abuse/dependence who presents with acute withdrawal symptoms and desire to detox. He has a long history of problems with alcohol and was able to quit last January for about a month without any major complications. Over the last 6+ months he has gone back to drinking heavily - mostly hard liquor (Fireball), but also beer - 30+ oz daily. He has stopped attending to his other medical issues during this time as well, and has been non-adherent to therapy for thyroid disease and hypertension. He decided to quit drinking on 10/8. He reportedly has not had any alcohol since that day.   10/16 Pt with NGT to suction with 50cc drainage present at time of RD visit. No family/visitors at bedside. Weight stable from yesterday.  Patient is currently intubated on ventilator support MV: 11.7 L/min Temp (24hrs), Avg:98 F (36.7 C), Min:97.5 F (36.4 C), Max:98.5 F (36.9 C) Propofol: 35.2 ml/hr (929 kcal)  Medications reviewed; 1 g IV folic acid/day, 100 mg thiamine/day, 15 mL liquid multivitamin/day. Labs reviewed; AST/ALT elevated.  Drip: Propofol @ 50 mcg/kg/min.    10/15 - Per RN, pt is not expected to extubate today and tube feedings are not planned to start.  - Pt awaiting abdominal ultrasound.  - Nutrition focused physical exam shows no sign of depletion of muscle mass or  body fat.  Patient is currently intubated on ventilator support MV: 11.5 L/min Temp (24hrs), Avg:99 F (37.2 C),  Max:100.9 F (38.3 C) Propofol: 24.6 ml/hr- provides 649 fat kcal. *Recommendations provided above are unable to meet protein needs at this time given current Propofol infusion rate. Will monitor Propofol infusion and will adjust tube feeding recommendations accordingly.   Diet Order:  Diet NPO time specified  Skin:  Reviewed, no issues  Last BM:  10/15  Height:   Ht Readings from Last 1 Encounters:  07/15/16 6\' 2"  (1.88 m)    Weight:   Wt Readings from Last 1 Encounters:  07/16/16 259 lb 4.2 oz (117.6 kg)    Ideal Body Weight:  86.4 kg  BMI:  Body mass index is 33.29 kg/m.  Estimated Nutritional Needs:   Kcal:  [id number] (11-14 kcal/kg actual body weight)  Protein:  155-173 grams (1.8-2 grams/kg IBW)  Fluid:  1.5L/day  EDUCATION NEEDS:   No education needs identified at this time    Trenton GammonJessica Esaw Knippel, MS, RD, LDN Inpatient Clinical Dietitian Pager # [phone] After hours/weekend pager # [phone]

## 2016-07-16 NOTE — Progress Notes (Signed)
NUTRITION NOTE  Consult for TF initiation and management received. Full note at 0940 today. Will order 60 mL Prostat QID d/t current Propofol rate (35.2 mL/hr providing 929 kcal). This regimen will provide 1729 kcal (105% maximum estimated kcal need), 120 grams of protein (77% minimum estimated protein need).    Estimated Nutritional Needs:  Kcal:  [id number] (11-14 kcal/kg actual body weight) Protein:  155-173 grams (1.8-2 grams/kg IBW) Fluid:  1.5L/day   RD will follow-up tomorrow and hopeful to initiate TF formula if Propofol able to be weaned by that time.   Raymond GammonJessica Desarai Barrack, MS, RD, LDN Inpatient Clinical Dietitian Pager # [phone] After hours/weekend pager # [phone]

## 2016-07-16 NOTE — Progress Notes (Signed)
PULMONARY / CRITICAL CARE MEDICINE   Name: Raymond Gomez MRN: [id number] DOB: [birth year]    ADMISSION DATE:  07/14/2016 CONSULTATION DATE:    REFERRING MD:  EDP  CHIEF COMPLAINT:  Shaking, confusion  BRIEF SUMMARY: 2M with PMH significant for hypertension, hypothyroidism and alcohol abuse/dependence admitted 11/17 with acute withdrawal symptoms and desire to detox. He has a long history of problems with alcohol and was able to quit last January for about a month without any major complications. Over the last 6+ months he has gone back to drinking heavily - mostly hard liquor (Fireball), but also beer - 30+ oz daily. He has stopped attending to his other medical issues during this time as well, and has been non-adherent to therapy for thyroid disease and hypertension. He decided to quit drinking on 10/8. He reportedly has not had any alcohol since that day. Over the last 48h he has developed worsening tremor, agitation, and hallucinations (auditory and visual). His parents finally convinced him to come to the ED for evaluation before going to an inpatient rehab. He was agitated and confused / seeing multiple other individuals not in the exam room. He believed he had been in a plane crash and survived by  jumping out of the plane. He was able to tell verbalize on admit that it is 2017 and October, but reports his age as 35. He was able to recognize his mother when asked, but mis-identified his father.  He decompensated due to agitated delirium / withdrawal symptoms and required intubation.    SUBJECTIVE:  RN reports periods of intermittent agitation / delirium.    VITAL SIGNS: BP (!) 189/109   Pulse 90   Temp 98.5 F (36.9 C) (Oral)   Resp 20   Ht 6\' 2"  (1.88 m)   Wt 259 lb 4.2 oz (117.6 kg)   SpO2 100%   BMI 33.29 kg/m   HEMODYNAMICS:    VENTILATOR SETTINGS: Vent Mode: PRVC FiO2 (%):  [40 %-50 %] 40 % Set Rate:  [18 bmp] 18 bmp Vt Set:  [640 mL] 640 mL PEEP:  [5 cmH20] 5  cmH20 Plateau Pressure:  [13 cmH20-17 cmH20] 17 cmH20  INTAKE / OUTPUT: I/O last 3 completed shifts: In: 3941.9 [I.V.:1791.9; Other:100; IV Piggyback:2050] Out: 2870 [Urine:1995; Emesis/NG output:875]  PHYSICAL EXAMINATION:  General Well nourished, well developed male in NAD, flushed  HEENT No gross abnormalities. Oropharynx clear. Mallampati II. Good dentition.   Pulmonary Clear to auscultation bilaterally with no wheezes, rales or ronchi. Good effort, symmetrical expansion.   Cardiovascular Normal rate, regular rhythm. S1, s2. No m/r/g. Distal pulses palpable.  Abdomen Soft, non-tender, non-distended, positive bowel sounds, no palpable organomegaly or masses.   Musculoskeletal Grossly normal. No bony deformities  Lymphatics No cervical, supraclavicular or axillary adenopathy.   Neurologic Grossly intact. No focal deficits.   Skin/Integuement No rash, no cyanosis, no clubbing.      LABS:  BMET  Recent Labs Lab 07/14/16 1957 07/15/16 0314 07/16/16 0359  NA 133* 136 139  K 3.5 3.4* 3.9  CL 99* 105 106  CO2 23 20* 21*  BUN 15 15 12   CREATININE 0.93 1.19 0.84  GLUCOSE 101* 108* 74    Electrolytes  Recent Labs Lab 07/14/16 1957 07/15/16 0314 07/16/16 0359  CALCIUM 10.3 9.3 9.3  MG  --  1.4*  --   PHOS  --  2.8  --     CBC  Recent Labs Lab 07/14/16 1957 07/15/16 0314 07/16/16 0359  WBC 9.3  11.9* 10.6*  HGB 17.1* 15.1 16.5  HCT 46.7 42.0 45.7  PLT 120* 123* 107*    Coag's  Recent Labs Lab 07/14/16 1956  INR 1.03    Sepsis Markers No results for input(s): LATICACIDVEN, PROCALCITON, O2SATVEN in the last 168 hours.  ABG  Recent Labs Lab 07/15/16 0352 07/15/16 0947  PHART 7.371 7.396  PCO2ART 34.1 32.2  PO2ART 184* 107    Liver Enzymes  Recent Labs Lab 07/14/16 1957 07/16/16 0359  AST 136* 201*  ALT 131* 125*  ALKPHOS 115 88  BILITOT 4.4* 3.9*  ALBUMIN 4.5 3.9    Cardiac Enzymes No results for input(s): TROPONINI, PROBNP in  the last 168 hours.  Glucose  Recent Labs Lab 07/15/16 0753 07/15/16 1215 07/15/16 1541 07/16/16 0745  GLUCAP 83 83 85 72    Imaging Dg Chest Port 1 View  Result Date: 07/16/2016 CLINICAL DATA:  Respiratory failure. EXAM: PORTABLE CHEST 1 VIEW COMPARISON:  07/15/2016 . FINDINGS: Endotracheal tube in stable position. Lower hemidiaphragms not completely imaged. NG tube tip not identified. Low lung volumes with right base atelectasis. Stable cardiomegaly. IMPRESSION: 1. Endotracheal tube in stable position. Lower hemidiaphragms not completely imaged. NG tube tip not identified. 2.  Stable cardiomegaly. 3. Low lung volumes with right base atelectasis . Electronically Signed   By: Maisie Fus  Register   On: 07/16/2016 07:17   US Abdomen Limited Ruq  Result Date: 07/15/2016 CLINICAL DATA:  Abnormal liver function tests EXAM: US ABDOMEN LIMITED - RIGHT UPPER QUADRANT COMPARISON:  None. FINDINGS: Gallbladder: Sludge and 6 mm non shadowing echogenic focus near the neck. No wall thickening to suggest acute cholecystitis. Unable to assess Murphy's in this intubated patient. Common bile duct: Diameter: 6 mm Liver: Echogenic with poor acoustic penetration.  No focal finding. IMPRESSION: 1. No evidence of cholecystitis. 2. Gallbladder sludge and questionable small calculus. 3. Hepatic steatosis. Electronically Signed   By: Marnee Spring M.D.   On: 07/15/2016 16:49     STUDIES:  RUQ [place] 10/15 >> no evidence of cholecystitis, gallbladder sludge & questionable small calculus, hepatic steatosis  CULTURES:   ANTIBIOTICS:   SIGNIFICANT EVENTS: 10/15  Admit with ETOH withdrawal, intubated   LINES/TUBES: PIV  DISCUSSION: Raymond Gomez is a 35M who presents with acute alcohol withdrawal with visual and auditory hallucinations, tremors, hypertension and significant agitation. His PMH is significant only for hypothyroidism and hypertension. He is interested in getting sober.   ASSESSMENT /  PLAN: NEUROLOGIC A:   Acute encephalopathy - secondary to ETOH withdrawal with associated delirium tremens  Agitated Delirium  Alcohol Withdrawal  P:   RASS goal: 0 to -2 Add precedex gtt on 10/16 Work to get propofol off  Add low dose klonopin (0.5 mg) to aide with weaning Thiamine, folate, MVI PRN Ativan / Versed for agitation PRN Haldol    PULMONARY A: Acute Respiratory Failure with Hypoxia - in the setting of alcohol withdrawal / agitated delirium  P:   PRVC 8 cc/kg  Wean PEEP / FiO2 for sats > 92% Trend CXR  VAP prevention measures  PSV wean as tolerated  Daily SBT / WUA   GASTROINTESTINAL A:   Hepatic dysfunction - AST/ALT/Tbili elevations - likely evolving alcoholic liver disease P:   Trend liver enzymes NPO  Begin TF for nutrition   CARDIOVASCULAR A:  Tachycardia - likely withdrawal mediated Hypertension P:  Treat underlying disorder PRN labetalol and/or hydralazine for SBP > 180 May need low dose clonidine for HTN / aide with withdrawal  RENAL A:   Hypokalemia  Hypomagnesemia Hyponatremia  Mild Hematuria - suspect from agitation / trauma P:   Trend BMP / UOP  Replace electrolytes as indicated   HEMATOLOGIC A:   Thrombocytopenia - likely related to hepatic dysfunction, INR wnl P:  Trend CBC Lovenox for DVT prophylaxis   INFECTIOUS A:   No acute issues P:   Monitor fever curve / WBC   ENDOCRINE A:   Hx Hypothyroidism - reportedly non-compliant with meds, TSH wnl on admit P:   Continue synthroid  Monitor CBG   FAMILY  - Updates:  No family at bedside am 10/16 for update.  - Inter-disciplinary family meet or Palliative Care meeting due by:  day 7  Canary BrimBrandi Ollis, NP-C [facility] Pgr: [phone] or if no answer [phone] 07/16/2016, 9:04 AM  Attending Note:  I have examined patient, reviewed labs, studies and notes. I have discussed the case with B Ollis, and I agree with the data and plans as amended above. 35  yo man with hypothyroidism and EtOH abuse. He was admitted with severe encephalopathy and DT after stopping EtOH. He is intubated for airway protection, sedated for agitated delirium. On my eval today he is difficult to rouse on propofol 50. Per RN he was very active, moved all ext and was a danger to extubate when sedation lightened. He did have some purposeful movements and reportedly did follow command to squeeze hands. His breath sounds are clear. RR 16, set on 16. Slows to 12 when vent rate is decreased to 12, no breaths over set rate. We will plan to work towards a transition from propofol to precedex to facilitate vent weaning. Will add enteral clonazepam as well. Consider clonidine. Start TF on 10/16 since no extubation today. Continue synthroid. Independent critical care time is 35 minutes.   Levy Pupaobert Ife Vitelli, MD, PhD 07/16/2016, 10:40 AM [facility] [phone] or if no answer [phone]

## 2016-07-16 NOTE — Care Management Note (Signed)
Case Management Note  Patient Details  Name: Raymond Gomez MRN: [id number] Date of Birth: Feb 23, 1981  Subjective/Objective:    etoh w/d with need for paralytics and vent.                Action/Plan: home versus inpatient rehab for detox.   Expected Discharge Date:                  [facility]  In-House Referral:  Clinical Social Work  Discharge planning Services     Post Acute Care Choice:    Choice offered to:     DME Arranged:    DME Agency:     HH Arranged:    HH Agency:     Status of Service:  In process, will continue to follow  If discussed at Long Length of Stay Meetings, dates discussed:    Additional Comments:  Golda AcreDavis, Rhonda Lynn, RN 07/16/2016, 9:51 AM

## 2016-07-17 ENCOUNTER — Inpatient Hospital Stay (HOSPITAL_COMMUNITY): Payer: BLUE CROSS/BLUE SHIELD

## 2016-07-17 LAB — CBC
HCT: 43.2 % (ref 39.0–52.0)
HEMOGLOBIN: 15 g/dL (ref 13.0–17.0)
MCH: 35.5 pg — ABNORMAL HIGH (ref 26.0–34.0)
MCHC: 34.7 g/dL (ref 30.0–36.0)
MCV: 102.1 fL — ABNORMAL HIGH (ref 78.0–100.0)
Platelets: 117 10*3/uL — ABNORMAL LOW (ref 150–400)
RBC: 4.23 MIL/uL (ref 4.22–5.81)
RDW: 13.3 % (ref 11.5–15.5)
WBC: 8.9 10*3/uL (ref 4.0–10.5)

## 2016-07-17 LAB — COMPREHENSIVE METABOLIC PANEL
ALK PHOS: 79 U/L (ref 38–126)
ALT: 97 U/L — ABNORMAL HIGH (ref 17–63)
ANION GAP: 9 (ref 5–15)
AST: 134 U/L — ABNORMAL HIGH (ref 15–41)
Albumin: 3.4 g/dL — ABNORMAL LOW (ref 3.5–5.0)
BILIRUBIN TOTAL: 5.1 mg/dL — AB (ref 0.3–1.2)
BUN: 17 mg/dL (ref 6–20)
CALCIUM: 9.1 mg/dL (ref 8.9–10.3)
CO2: 23 mmol/L (ref 22–32)
Chloride: 107 mmol/L (ref 101–111)
Creatinine, Ser: 0.72 mg/dL (ref 0.61–1.24)
GLUCOSE: 95 mg/dL (ref 65–99)
POTASSIUM: 3.7 mmol/L (ref 3.5–5.1)
Sodium: 139 mmol/L (ref 135–145)
TOTAL PROTEIN: 6.9 g/dL (ref 6.5–8.1)

## 2016-07-17 LAB — GLUCOSE, CAPILLARY
GLUCOSE-CAPILLARY: 113 mg/dL — AB (ref 65–99)
GLUCOSE-CAPILLARY: 75 mg/dL (ref 65–99)
GLUCOSE-CAPILLARY: 81 mg/dL (ref 65–99)
GLUCOSE-CAPILLARY: 83 mg/dL (ref 65–99)
Glucose-Capillary: 85 mg/dL (ref 65–99)

## 2016-07-17 LAB — PHOSPHORUS: Phosphorus: 2.9 mg/dL (ref 2.5–4.6)

## 2016-07-17 LAB — MAGNESIUM: MAGNESIUM: 1.9 mg/dL (ref 1.7–2.4)

## 2016-07-17 MED ORDER — INFLUENZA VAC SPLIT QUAD 0.5 ML IM SUSY
0.5000 mL | PREFILLED_SYRINGE | INTRAMUSCULAR | Status: AC
  Administered 2016-07-18: 0.5 mL via INTRAMUSCULAR
  Filled 2016-07-17: qty 0.5

## 2016-07-17 MED ORDER — ORAL CARE MOUTH RINSE: 15.0000 mL | Freq: Two times a day (BID) | OROMUCOSAL | Status: DC

## 2016-07-17 MED ORDER — VITAMIN B-1 100 MG PO TABS
100.0000 mg | ORAL_TABLET | Freq: Every day | ORAL | Status: DC
  Administered 2016-07-17 – 2016-07-20 (×4): 100 mg via ORAL
  Filled 2016-07-17 (×4): qty 1

## 2016-07-17 MED ORDER — DEXMEDETOMIDINE HCL IN NACL 200 MCG/50ML IV SOLN
0.4000 ug/kg/h | INTRAVENOUS | Status: AC
  Administered 2016-07-17 – 2016-07-18 (×6): 0.5 ug/kg/h via INTRAVENOUS
  Filled 2016-07-17 (×8): qty 50

## 2016-07-17 MED ORDER — PNEUMOCOCCAL VAC POLYVALENT 25 MCG/0.5ML IJ INJ
0.5000 mL | INJECTION | INTRAMUSCULAR | Status: AC
  Administered 2016-07-18: 0.5 mL via INTRAMUSCULAR
  Filled 2016-07-17: qty 0.5

## 2016-07-17 MED ORDER — CHLORHEXIDINE GLUCONATE 0.12 % MT SOLN: 15.0000 mL | Freq: Two times a day (BID) | OROMUCOSAL | Status: DC

## 2016-07-17 NOTE — Progress Notes (Signed)
PULMONARY / CRITICAL CARE MEDICINE   Name: Raymond Gomez MRN: [id number] DOB: 1981-07-22    ADMISSION DATE:  07/14/2016 CONSULTATION DATE:    REFERRING MD:  EDP  CHIEF COMPLAINT:  Shaking, confusion  BRIEF SUMMARY: 35M with PMH significant for hypertension, hypothyroidism and alcohol abuse/dependence admitted 11/17 with acute withdrawal symptoms and desire to detox. He has a long history of problems with alcohol and was able to quit last January for about a month without any major complications. Over the last 6+ months he has gone back to drinking heavily - mostly hard liquor (Fireball), but also beer - 30+ oz daily. He has stopped attending to his other medical issues during this time as well, and has been non-adherent to therapy for thyroid disease and hypertension. He decided to quit drinking on 10/8. He reportedly has not had any alcohol since that day. Over the last 48h he has developed worsening tremor, agitation, and hallucinations (auditory and visual). His parents finally convinced him to come to the ED for evaluation before going to an inpatient rehab. He was agitated and confused / seeing multiple other individuals not in the exam room. He believed he had been in a plane crash and survived by  jumping out of the plane. He was able to tell verbalize on admit that it is 2017 and October, but reports his age as 35. He was able to recognize his mother when asked, but mis-identified his father.  He decompensated due to agitated delirium / withdrawal symptoms and required intubation.    SUBJECTIVE:  RN reports patient calm on precedex gtt.  PSV 5/5 wean started at 0900.  Pt calm / following instructions.  Afebrile.    VITAL SIGNS: BP (!) 135/113   Pulse 72   Temp 98.2 F (36.8 C) (Axillary)   Resp 14   Ht 6\' 2"  (1.88 m)   Wt 257 lb 0.9 oz (116.6 kg)   SpO2 100%   BMI 33.00 kg/m   HEMODYNAMICS:    VENTILATOR SETTINGS: Vent Mode: PRVC FiO2 (%):  [30 %-40 %] 30 % Set Rate:   [14 bmp] 14 bmp Vt Set:  [640 mL] 640 mL PEEP:  [5 cmH20] 5 cmH20 Plateau Pressure:  [16 cmH20-17 cmH20] 16 cmH20  INTAKE / OUTPUT: I/O last 3 completed shifts: In: 929.1 [I.V.:929.1] Out: 2185 [Urine:1710; Emesis/NG output:475]  PHYSICAL EXAMINATION:  General Well nourished, well developed male in NAD, flushing improved  HEENT No gross abnormalities. Oropharynx clear. Mallampati II. Good dentition.   Pulmonary Clear to auscultation bilaterally with no wheezes, rales or ronchi. Good effort, symmetrical expansion.   Cardiovascular Normal rate, regular rhythm. S1, s2. No m/r/g. Distal pulses palpable.  Abdomen Soft, non-tender, non-distended, positive bowel sounds, no palpable organomegaly or masses.   Musculoskeletal Grossly normal. No bony deformities  Lymphatics No cervical, supraclavicular or axillary adenopathy.   Neurologic Grossly intact. No focal deficits.   Skin/Integuement No rash, no cyanosis, no clubbing.      LABS:  BMET  Recent Labs Lab 07/15/16 0314 07/16/16 0359 07/17/16 0319  NA 136 139 139  K 3.4* 3.9 3.7  CL 105 106 107  CO2 20* 21* 23  BUN 15 12 17   CREATININE 1.19 0.84 0.72  GLUCOSE 108* 74 95    Electrolytes  Recent Labs Lab 07/15/16 0314 07/16/16 0359 07/16/16 1709 07/17/16 0319  CALCIUM 9.3 9.3  --  9.1  MG 1.4* 2.1 2.2 1.9  PHOS 2.8 2.4* 3.1 2.9    CBC  Recent  Labs Lab 07/15/16 0314 07/16/16 0359 07/17/16 0319  WBC 11.9* 10.6* 8.9  HGB 15.1 16.5 15.0  HCT 42.0 45.7 43.2  PLT 123* 107* 117*    Coag's  Recent Labs Lab 07/14/16 1956  INR 1.03    Sepsis Markers No results for input(s): LATICACIDVEN, PROCALCITON, O2SATVEN in the last 168 hours.  ABG  Recent Labs Lab 07/15/16 0352 07/15/16 0947  PHART 7.371 7.396  PCO2ART 34.1 32.2  PO2ART 184* 107    Liver Enzymes  Recent Labs Lab 07/14/16 1957 07/16/16 0359 07/17/16 0319  AST 136* 201* 134*  ALT 131* 125* 97*  ALKPHOS 115 88 79  BILITOT 4.4* 3.9*  5.1*  ALBUMIN 4.5 3.9 3.4*    Cardiac Enzymes No results for input(s): TROPONINI, PROBNP in the last 168 hours.  Glucose  Recent Labs Lab 07/16/16 0745 07/16/16 1350 07/16/16 1541 07/16/16 1939 07/16/16 2302 07/17/16 0733  GLUCAP 72 100* 130* 111* 102* 81    Imaging Dg Chest Port 1 View  Result Date: 07/17/2016 CLINICAL DATA:  Respiratory failure. EXAM: PORTABLE CHEST 1 VIEW COMPARISON:  07/16/2016. FINDINGS: Endotracheal tube and NG tube in stable position. Low lung volumes with bibasilar atelectasis. Stable cardiomegaly. No pleural effusion or pneumothorax. IMPRESSION: 1. Lines and tubes in stable position. 2. Low lung volumes with bibasilar atelectasis. 3. Stable cardiomegaly . Electronically Signed   By: Maisie Fus  Register   On: 07/17/2016 07:30     STUDIES:  RUQ [place] 10/15 >> no evidence of cholecystitis, gallbladder sludge & questionable small calculus, hepatic steatosis  CULTURES:   ANTIBIOTICS:   SIGNIFICANT EVENTS: 10/15  Admit with ETOH withdrawal, intubated  10/16  Transitioned to precedex gtt 10/17  Weaning on PSV  LINES/TUBES: PIV  DISCUSSION: Mr. Giammarco is a 35M who presents with acute alcohol withdrawal with visual and auditory hallucinations, tremors, hypertension and significant agitation. His PMH is significant only for hypothyroidism and hypertension. He is interested in getting sober.  Intubated for respiratory distress with alcohol withdrawal.    ASSESSMENT / PLAN: NEUROLOGIC A:   Acute encephalopathy - secondary to ETOH withdrawal with associated delirium tremens  Agitated Delirium  Alcohol Withdrawal  P:   RASS goal: 0  Continue precedex gtt post extubation Low dose klonopin (0.5 mg) to aide with weaning Thiamine, folate, MVI PRN Ativan / Versed for agitation PRN Haldol   Will need ETOH counseling while inpatient / post discharge   PULMONARY A: Acute Respiratory Failure with Hypoxia - in the setting of alcohol withdrawal / agitated  delirium  P:   SBT with plan for extubation this am  O2 as needed to support sats > 92% Trend CXR  VAP prevention measures  Pulmonary hygiene - IS, mobilize   GASTROINTESTINAL A:   Hepatic dysfunction - AST/ALT/Tbili elevations - likely evolving alcoholic liver disease P:   Trend liver enzymes NPO x4 hours post extubation, then advance as tolerated  CARDIOVASCULAR A:  Tachycardia - likely withdrawal mediated Hypertension P:  Treat underlying disorder PRN labetalol and/or hydralazine for SBP > 180   RENAL A:   Hypokalemia  Hypomagnesemia Hyponatremia  Mild Hematuria - suspect from agitation / trauma P:   Trend BMP / UOP  Replace electrolytes as indicated   HEMATOLOGIC A:   Thrombocytopenia - likely related to hepatic dysfunction, INR wnl P:  Trend CBC Lovenox for DVT prophylaxis   INFECTIOUS A:   No acute issues P:   Monitor fever curve / WBC   ENDOCRINE A:   Hx Hypothyroidism -  reportedly non-compliant with meds, TSH wnl on admit P:   Continue synthroid  Monitor CBG   FAMILY  - Updates:  No family at bedside am 10/17 for update.  - Inter-disciplinary family meet or Palliative Care meeting due by:  day 7  Canary BrimBrandi Ollis, NP-C [facility] Pgr: [phone] or if no answer [phone] 07/17/2016, 9:00 AM  Attending Note:  I have examined patient, reviewed labs, studies and notes. I have discussed the case with B Ollis, and I agree with the data and plans as amended above. 35 yo man with hypothyroidism and EtOH abuse. He was admitted with severe encephalopathy and DT after stopping EtOH (he wants to quit completely). He was intubated for airway protection, sedated for agitated delirium. Over the last 24h he has been successfully transitioned from propofol to precedex. On my eval this am he is responsive but calm. Tolerating PSV. Lungs clear. Good cough and strength. We will pan to extubate him this am on precedex, then wean the sedation as  able. Suspect he will need a longer benzo taper once ready to be off the precedex.  Independent critical care time is 40 minutes.   Levy Pupaobert Sarah Baez, MD, PhD 07/17/2016, 9:44 AM [facility] [phone] or if no answer [phone]

## 2016-07-17 NOTE — Procedures (Signed)
Extubation Procedure Note  Patient Details:   Name: Raymond Gomez DOB: [birth year] MRN: [id number]   Airway Documentation:  Airway 8 mm (Active)  Secured at (cm) 25 cm 07/17/2016  7:40 AM  Measured From Lips 07/17/2016  7:40 AM  Secured Location Right 07/17/2016  7:40 AM  Secured By [employer] Tube Holder 07/17/2016  7:40 AM  Tube Holder Repositioned Yes 07/17/2016  7:40 AM  Cuff Pressure (cm H2O) 25 cm H2O 07/17/2016  7:40 AM  Site Condition Dry 07/16/2016  3:45 AM    Evaluation  O2 sats: 100 Complications: none Patient tolerated procedure well. Bilateral Breath Sounds: dmn   Pt able to speak Per CCM order pt extubated.  Pt tolerated procedure well, no complications.  Revonda HumphreyDePue, Marlon Vonruden S 07/17/2016, 9:25 AM

## 2016-07-17 NOTE — Progress Notes (Signed)
Nutrition Follow-up  DOCUMENTATION CODES:   Obesity unspecified  INTERVENTION:  - Diet advancement as medically feasible/appropriate. - RD will follow-up 10/19.  NUTRITION DIAGNOSIS:   Inadequate oral intake related to inability to eat as evidenced by NPO status. -ongoing  GOAL:   Patient will meet greater than or equal to 90% of their needs -unmet  MONITOR:   Diet advancement, Weight trends, Labs, I & O's  ASSESSMENT:   59M with PMH significant for hypertension, hypothyroidism and alcohol abuse/dependence who presents with acute withdrawal symptoms and desire to detox. He has a long history of problems with alcohol and was able to quit last January for about a month without any major complications. Over the last 6+ months he has gone back to drinking heavily - mostly hard liquor (Fireball), but also beer - 30+ oz daily. He has stopped attending to his other medical issues during this time as well, and has been non-adherent to therapy for thyroid disease and hypertension. He decided to quit drinking on 10/8. He reportedly has not had any alcohol since that day.   10/17 Pt extubated this AM and NGT removed at that time. Estimated nutrition needs updated based on extubation. Pt sleeping at this time with no family/visitors present. Weight remains stable. Will monitor for diet advancement and associated needs at follow-up.   Medications reviewed; 1 mg folic acid/day, 15 mL liquid multivitamin/day, 40 mg Protonix/day, 100 mg thiamine/day. Labs reviewed.   10/16 - Pt with NGT to suction with 50cc drainage present at time of RD visit.  - No family/visitors at bedside.  - Weight stable from yesterday. - TF consult placed and order placed by this RD for 60 mL Prostat QID (800 kcal, 120 grams of protein).  Patient is currently intubated on ventilator support MV: 11.7 L/min Temp (24hrs), Avg:98 F (36.7 C), Min:97.5 F (36.4 C), Max:98.5 F (36.9 C) Propofol: 35.2 ml/hr (929  kcal) Drip: Propofol @ 50 mcg/kg/min.   10/15 - Per RN, pt is not expected to extubate today and tube feedings are not planned to start.  - Pt awaiting abdominal ultrasound.  - Nutrition focused physical exam shows no sign of depletion of muscle mass or body fat.  Patient is currently intubated on ventilator support MV: 11.5L/min Temp (24hrs), Avg:99 F (37.2 C),  Max:100.9 F (38.3 C) Propofol: 24.106ml/hr- provides 649 fat kcal. *Recommendations provided above are unable to meet protein needs at this time given current Propofol infusion rate. Will monitor Propofol infusion and will adjust tube feeding recommendations accordingly.   Diet Order:  Diet NPO time specified  Skin:  Reviewed, no issues  Last BM:  10/15  Height:   Ht Readings from Last 1 Encounters:  07/16/16 6\' 2"  (1.88 m)    Weight:   Wt Readings from Last 1 Encounters:  07/17/16 257 lb 0.9 oz (116.6 kg)    Ideal Body Weight:  86.4 kg  BMI:  Body mass index is 33 kg/m.  Estimated Nutritional Needs:   Kcal:  2100-2300  Protein:  115-125 grams  Fluid:  >/= 2 L/day  EDUCATION NEEDS:   No education needs identified at this time    Trenton GammonJessica Gurney Balthazor, MS, RD, LDN Inpatient Clinical Dietitian Pager # [phone] After hours/weekend pager # [phone]

## 2016-07-18 ENCOUNTER — Inpatient Hospital Stay (HOSPITAL_COMMUNITY): Payer: BLUE CROSS/BLUE SHIELD

## 2016-07-18 DIAGNOSIS — J9601 Acute respiratory failure with hypoxia: Secondary | ICD-10-CM

## 2016-07-18 LAB — CBC
HCT: 41.4 % (ref 39.0–52.0)
Hemoglobin: 14.5 g/dL (ref 13.0–17.0)
MCH: 35.3 pg — AB (ref 26.0–34.0)
MCHC: 35 g/dL (ref 30.0–36.0)
MCV: 100.7 fL — AB (ref 78.0–100.0)
PLATELETS: 157 10*3/uL (ref 150–400)
RBC: 4.11 MIL/uL — AB (ref 4.22–5.81)
RDW: 12.9 % (ref 11.5–15.5)
WBC: 6.9 10*3/uL (ref 4.0–10.5)

## 2016-07-18 LAB — BASIC METABOLIC PANEL
Anion gap: 7 (ref 5–15)
BUN: 13 mg/dL (ref 6–20)
CHLORIDE: 107 mmol/L (ref 101–111)
CO2: 23 mmol/L (ref 22–32)
CREATININE: 0.7 mg/dL (ref 0.61–1.24)
Calcium: 9.2 mg/dL (ref 8.9–10.3)
GFR calc Af Amer: >60 mL/min (ref >60)
GFR calc non Af Amer: >60 mL/min (ref >60)
Glucose, Bld: 100 mg/dL — ABNORMAL HIGH (ref 65–99)
Potassium: 3.5 mmol/L (ref 3.5–5.1)
Sodium: 137 mmol/L (ref 135–145)

## 2016-07-18 LAB — GLUCOSE, CAPILLARY: Glucose-Capillary: 94 mg/dL (ref 65–99)

## 2016-07-18 MED ORDER — LORAZEPAM 2 MG/ML IJ SOLN: 2.0000 mg | INTRAMUSCULAR | Status: DC | PRN

## 2016-07-18 MED ORDER — HYDRALAZINE HCL 20 MG/ML IJ SOLN: 10.0000 mg | INTRAMUSCULAR | Status: DC | PRN

## 2016-07-18 MED ORDER — ADULT MULTIVITAMIN W/MINERALS CH
1.0000 | ORAL_TABLET | Freq: Every day | ORAL | Status: DC
  Administered 2016-07-18 – 2016-07-20 (×3): 1 via ORAL
  Filled 2016-07-18 (×3): qty 1

## 2016-07-18 MED ORDER — POTASSIUM CHLORIDE CRYS ER 20 MEQ PO TBCR
40.0000 meq | EXTENDED_RELEASE_TABLET | Freq: Once | ORAL | Status: AC
  Administered 2016-07-18: 40 meq via ORAL
  Filled 2016-07-18: qty 2

## 2016-07-18 MED ORDER — CLONAZEPAM 0.5 MG PO TABS: 0.5000 mg | ORAL_TABLET | Freq: Two times a day (BID) | ORAL | Status: DC

## 2016-07-18 MED ORDER — PANTOPRAZOLE SODIUM 40 MG PO TBEC
40.0000 mg | DELAYED_RELEASE_TABLET | Freq: Every day | ORAL | Status: DC
  Administered 2016-07-18 – 2016-07-20 (×3): 40 mg via ORAL
  Filled 2016-07-18 (×3): qty 1

## 2016-07-18 NOTE — Progress Notes (Signed)
CSW consulted to assist with SA ( ETOH ) resources. PN reviewed. CSW will meet with pt once cognitive status improves. CIWA protocol started today.   Cori RazorJamie Blayklee Mable LCSW [phone]

## 2016-07-18 NOTE — Progress Notes (Signed)
PULMONARY / CRITICAL CARE MEDICINE   Name: Raymond Gomez MRN: [id number] DOB: December 07, 1980    ADMISSION DATE:  07/14/2016 CONSULTATION DATE:    REFERRING MD:  EDP  CHIEF COMPLAINT:  Shaking, confusion  BRIEF SUMMARY: 2M with PMH significant for hypertension, hypothyroidism and alcohol abuse/dependence admitted 11/17 with acute withdrawal symptoms and desire to detox. He has a long history of problems with alcohol and was able to quit last January for about a month without any major complications. Over the last 6+ months he has gone back to drinking heavily - mostly hard liquor (Fireball), but also beer - 30+ oz daily. He has stopped attending to his other medical issues during this time as well, and has been non-adherent to therapy for thyroid disease and hypertension. He decided to quit drinking on 10/8. He reportedly has not had any alcohol since that day. Over the last 48h he has developed worsening tremor, agitation, and hallucinations (auditory and visual). His parents finally convinced him to come to the ED for evaluation before going to an inpatient rehab. He was agitated and confused / seeing multiple other individuals not in the exam room. He believed he had been in a plane crash and survived by  jumping out of the plane. He was able to tell verbalize on admit that it is 2017 and October, but reports his age as 35. He was able to recognize his mother when asked, but mis-identified his father.  He decompensated due to agitated delirium / withdrawal symptoms and required intubation.    SUBJECTIVE:  RN reports patient calm on 0.5 precedex gtt.  Tolerated extubation 10/17.  No respiratory distress.   VITAL SIGNS: BP (!) 158/100   Pulse 65   Temp 98.4 F (36.9 C) (Oral)   Resp (!) 23   Ht 6\' 2"  (1.88 m)   Wt 257 lb 8 oz (116.8 kg)   SpO2 99%   BMI 33.06 kg/m   HEMODYNAMICS:    VENTILATOR SETTINGS:    INTAKE / OUTPUT: I/O last 3 completed shifts: In: 1152.4 [P.O.:560;  I.V.:592.4] Out: 1070 [Urine:1020; Emesis/NG output:50]  PHYSICAL EXAMINATION:  General Well nourished, well developed male in NAD  HEENT No gross abnormalities. Oropharynx clear. Mallampati II. Good dentition.   Pulmonary Clear to auscultation bilaterally with no wheezes, rales or ronchi. Good effort, symmetrical expansion.   Cardiovascular Normal rate, regular rhythm. S1, s2. No m/r/g. Distal pulses palpable.  Abdomen Soft, non-tender, non-distended, positive bowel sounds, no palpable organomegaly or masses.   Musculoskeletal Grossly normal. No bony deformities  Lymphatics No cervical, supraclavicular or axillary adenopathy.   Neurologic Grossly intact. No focal deficits.   Skin/Integuement No rash, no cyanosis, no clubbing.      LABS:  BMET  Recent Labs Lab 07/16/16 0359 07/17/16 0319 07/18/16 0325  NA 139 139 137  K 3.9 3.7 3.5  CL 106 107 107  CO2 21* 23 23  BUN 12 17 13   CREATININE 0.84 0.72 0.70  GLUCOSE 74 95 100*    Electrolytes  Recent Labs Lab 07/16/16 0359 07/16/16 1709 07/17/16 0319 07/18/16 0325  CALCIUM 9.3  --  9.1 9.2  MG 2.1 2.2 1.9  --   PHOS 2.4* 3.1 2.9  --     CBC  Recent Labs Lab 07/16/16 0359 07/17/16 0319 07/18/16 0325  WBC 10.6* 8.9 6.9  HGB 16.5 15.0 14.5  HCT 45.7 43.2 41.4  PLT 107* 117* 157    Coag's  Recent Labs Lab 07/14/16 1956  INR  1.03    Sepsis Markers No results for input(s): LATICACIDVEN, PROCALCITON, O2SATVEN in the last 168 hours.  ABG  Recent Labs Lab 07/15/16 0352 07/15/16 0947  PHART 7.371 7.396  PCO2ART 34.1 32.2  PO2ART 184* 107    Liver Enzymes  Recent Labs Lab 07/14/16 1957 07/16/16 0359 07/17/16 0319  AST 136* 201* 134*  ALT 131* 125* 97*  ALKPHOS 115 88 79  BILITOT 4.4* 3.9* 5.1*  ALBUMIN 4.5 3.9 3.4*    Cardiac Enzymes No results for input(s): TROPONINI, PROBNP in the last 168 hours.  Glucose  Recent Labs Lab 07/17/16 0733 07/17/16 1206 07/17/16 1546  07/17/16 2038 07/17/16 2346 07/18/16 0351  GLUCAP 81 75 85 83 113* 94    Imaging Dg Chest Port 1 View  Result Date: 07/18/2016 CLINICAL DATA:  Acute respiratory failure EXAM: PORTABLE CHEST 1 VIEW COMPARISON:  07/17/2016 FINDINGS: Endotracheal tube removed.  Gastric tube removed. Improved lung volume. Improvement in bibasilar atelectasis since yesterday. Negative for heart failure or edema. No pleural effusion. IMPRESSION: Improved lung volume. Improvement in bibasilar atelectasis following extubation. Electronically Signed   By: Marlan Palau M.D.   On: 07/18/2016 07:25     STUDIES:  RUQ [place] 10/15 >> no evidence of cholecystitis, gallbladder sludge & questionable small calculus, hepatic steatosis  CULTURES:   ANTIBIOTICS:   SIGNIFICANT EVENTS: 10/15  Admit with ETOH withdrawal, intubated  10/16  Transitioned to precedex gtt 10/17  Weaning on PSV, extubated  10/18  Precedex gtt d/c'd.  CIWA protocol initiated   LINES/TUBES: PIV  DISCUSSION: Mr. Dethloff is a 35M who presents with acute alcohol withdrawal with visual and auditory hallucinations, tremors, hypertension and significant agitation. His PMH is significant only for hypothyroidism and hypertension. He is interested in getting sober.  Intubated for respiratory distress with alcohol withdrawal.    ASSESSMENT / PLAN: NEUROLOGIC A:   Acute encephalopathy - secondary to ETOH withdrawal with associated delirium tremens  Agitated Delirium  Alcohol Withdrawal  P:   Begin CIWA protocol, liberalize to 2-4 mg Wean Precedex gtt to off  Discontinue klonopin  Thiamine, folate, MVI Social work consult for ETOH counseling while inpatient / post discharge   PULMONARY A: Acute Respiratory Failure with Hypoxia - in the setting of alcohol withdrawal / agitated delirium  P:   O2 as needed to support sats > 92% Trend CXR  Pulmonary hygiene - IS, mobilize   GASTROINTESTINAL A:   Hepatic dysfunction - AST/ALT/Tbili elevations  - likely evolving alcoholic liver disease P:   Trend liver enzymes Diet as tolerated   CARDIOVASCULAR A:  Tachycardia - likely withdrawal mediated Hypertension P:  Treat underlying disorder PRN labetalol and/or hydralazine for SBP > 170   RENAL A:   Hypokalemia  Hypomagnesemia Hyponatremia  Mild Hematuria - suspect from agitation / trauma P:   Trend BMP / UOP  Replace electrolytes as indicated   HEMATOLOGIC A:   Thrombocytopenia - likely related to hepatic dysfunction, INR wnl P:  Trend CBC Lovenox for DVT prophylaxis   INFECTIOUS A:   No acute issues P:   Monitor fever curve / WBC   ENDOCRINE A:   Hx Hypothyroidism - reportedly non-compliant with meds, TSH wnl on admit P:   Continue synthroid  Monitor CBG   FAMILY  - Updates:  No family at bedside am 10/18 for update.   Patient updated on plan of care.   - Inter-disciplinary family meet or Palliative Care meeting due by:  day 7   Change to  SDU status.  [facility] am.    Canary BrimBrandi Ollis, NP-C [facility] Pgr: [phone] or if no answer [phone] 07/18/2016, 9:41 AM   Attending Note:  I have examined patient, reviewed labs, studies and notes. I have discussed the case with B Ollis, and I agree with the data and plans as amended above.  35 yo man with hypothyroidism and EtOH abuse. He was admitted with severe encephalopathy and DT after stopping EtOH (he wants to quit completely). He was intubated for airway protection, sedated for agitated delirium.  On eval he is much improved. precedex at 0.5. Awake and answers questions clearly. Normal heart and lung exam. Normal VS. We will plan to restart CIWA-based ativan, wean precedex off. Hold clonazepam. Change to SDU status and ask TRH to assume his care am 10/19.  Independent CC time 31 minutes.    Levy Pupaobert Armella Stogner, MD, PhD 07/18/2016, 11:59 AM [facility] [phone] or if no answer  [phone]

## 2016-07-19 LAB — CBC
HCT: 42.1 % (ref 39.0–52.0)
Hemoglobin: 14.8 g/dL (ref 13.0–17.0)
MCH: 35.1 pg — ABNORMAL HIGH (ref 26.0–34.0)
MCHC: 35.2 g/dL (ref 30.0–36.0)
MCV: 99.8 fL (ref 78.0–100.0)
Platelets: 220 10*3/uL (ref 150–400)
RBC: 4.22 MIL/uL (ref 4.22–5.81)
RDW: 12.7 % (ref 11.5–15.5)
WBC: 6.7 10*3/uL (ref 4.0–10.5)

## 2016-07-19 LAB — BASIC METABOLIC PANEL
Anion gap: 9 (ref 5–15)
BUN: 10 mg/dL (ref 6–20)
CALCIUM: 9.5 mg/dL (ref 8.9–10.3)
CO2: 22 mmol/L (ref 22–32)
CREATININE: 0.73 mg/dL (ref 0.61–1.24)
Chloride: 106 mmol/L (ref 101–111)
GFR calc Af Amer: >60 mL/min (ref >60)
GLUCOSE: 88 mg/dL (ref 65–99)
Potassium: 3.6 mmol/L (ref 3.5–5.1)
Sodium: 137 mmol/L (ref 135–145)

## 2016-07-19 MED ORDER — LEVOTHYROXINE SODIUM 100 MCG PO TABS: 100.0000 ug | ORAL_TABLET | Freq: Every day | ORAL | Status: DC

## 2016-07-19 MED ORDER — ATORVASTATIN CALCIUM 40 MG PO TABS: 40.0000 mg | ORAL_TABLET | Freq: Every day | ORAL | Status: DC

## 2016-07-19 MED ORDER — LEVOTHYROXINE SODIUM 100 MCG PO TABS
100.0000 ug | ORAL_TABLET | Freq: Every day | ORAL | Status: DC
  Administered 2016-07-19 – 2016-07-20 (×2): 100 ug via ORAL
  Filled 2016-07-19 (×2): qty 1

## 2016-07-19 MED ORDER — METOPROLOL SUCCINATE ER 50 MG PO TB24
50.0000 mg | ORAL_TABLET | Freq: Every day | ORAL | Status: DC
  Administered 2016-07-19 – 2016-07-20 (×2): 50 mg via ORAL
  Filled 2016-07-19 (×2): qty 1

## 2016-07-19 MED ORDER — ATORVASTATIN CALCIUM 40 MG PO TABS
40.0000 mg | ORAL_TABLET | Freq: Every day | ORAL | Status: DC
  Administered 2016-07-19: 40 mg via ORAL
  Filled 2016-07-19: qty 1

## 2016-07-19 MED ORDER — TRAZODONE HCL 50 MG PO TABS
100.0000 mg | ORAL_TABLET | Freq: Every evening | ORAL | Status: DC | PRN
  Administered 2016-07-20: 100 mg via ORAL
  Filled 2016-07-19: qty 2

## 2016-07-19 MED ORDER — HYDROCOD POLST-CPM POLST ER 10-8 MG/5ML PO SUER
5.0000 mL | Freq: Once | ORAL | Status: AC
  Administered 2016-07-19: 5 mL via ORAL
  Filled 2016-07-19: qty 5

## 2016-07-19 NOTE — Progress Notes (Signed)
Date:  July 19, 2016 Chart reviewed for concurrent status and case management needs. Will continue to follow the patient for status change: Iv Precedex stopped started on CIWA protocol with iv ativan q1-2 hours Discharge Planning: following for needs Expected discharge date: [id number] Marcelle SmilingRhonda Davis, BSN, NewmanRN3, [place]CCM   [phone]

## 2016-07-19 NOTE — Progress Notes (Signed)
PROGRESS NOTE  Raymond Earthlyric Richard Schussler  WGN:[id number]:[id number] DOB: 1980-12-13 DOA: 07/14/2016 PCP: No PCP   Brief Narrative: Raymond Gomez is a 35 y.o. male with a history of alcohol abuse/dependence, hypothyroidism and HTN admitted 10/15 with acute alcohol withdrawal desiring detox. He has a long history of alcohol use and problem drinking, but had never required medical assistance with withdrawal. He last quit for 30 days in Jan 2017, but went back to heavily drinking a total of 30 alcoholic beverages (beer, shots, cocktails) daily. He also took no medications during this time and decided to quit drinking with his last drink on 10/8. He developed worsening tremor, agitation, and audiovisual hallucinations 48 hours prior to admission prompting his parents to convince him to go to the ED.On arrival he was agitated, disoriented, diaphoretic and hallucinating. He decompensated due to agitated delirium and withdrawal symptoms, requiring intubation. He was extubated 10/17, required precedex for withdrawal symptoms which was discontinued 10/18 as CIWA protocol was initiated. [facility] He has done well with CIWA scores not requiring prn ativan since 11/17.   Assessment & Plan: Active Problems:   Alcohol withdrawal (HCC)   Acute respiratory failure with hypoxemia (HCC)  Acute alcohol withdrawal with delirium tremens: Required intubation 10/15-10/17, weaned off precedex, on CIWA, last ativan IV dose 10/17. CIWA overnight 1, 0, 6, 6.  - Continue CIWA.  - Consulted SW for acute substance abuse, as pt is able to participate in discussions. Strongly recommended discharge to inpatient rehabilitation.  - Continue thiamine, folate, MVM  Acute respiratory failure: Secondary to agitated delirium, resolved.  - IS  Metabolic derangements: Blood work this AM normal. - Replete prn  Hypothyroidism: Not compliant with medications, but TSH 3.098 10/15.  - Continue synthroid  Hypertension:  Chronic with acutely uncontrolled BP.  - Restart metoprolol, monitor VS per protocol  DVT prophylaxis: Lovenox Code Status: Full Family Communication: None at bedside, declined offer to call at this time.  Disposition Plan: Transfer to telemetry today. Pending SW consult.   Consultants:   Pulmonology/CCM: Dr. Delton CoombesByrum  Procedures:  RUQ US 10/15 >> no evidence of cholecystitis, gallbladder sludge & questionable small calculus, hepatic steatosis  Antimicrobials:  None  Subjective: Pt feels well. Tremor is improving. No anxiety or sweating. No chest pain, dyspnea, palpitations. Ate breakfast without any issues.   Objective: Vitals:   07/19/16 0600 07/19/16 0704 07/19/16 0745 07/19/16 1200  BP: (!) 142/99  (!) 156/94 (!) 179/101  Pulse: 92  (!) 103 84  Resp: (!) 25  19 (!) 22  Temp:  99.3 F (37.4 C)  98.5 F (36.9 C)  TempSrc:  Oral  Oral  SpO2: 99%  98% 98%  Weight:      Height:        Intake/Output Summary (Last 24 hours) at 07/19/16 1326 Last data filed at 07/19/16 0800  Gross per 24 hour  Intake             1.93 ml  Output             1075 ml  Net         -1073.07 ml   Filed Weights   07/17/16 0409 07/18/16 0452 07/19/16 0500  Weight: 116.6 kg (257 lb 0.9 oz) 116.8 kg (257 lb 8 oz) 116.4 kg (256 lb 9.9 oz)    Examination: General exam: [age] y.o. male in no distress Respiratory system: Non-labored breathing ambient air. Clear to auscultation bilaterally.  Cardiovascular system: Regular rate and  rhythm. No murmur, rub, or gallop. No JVD, and no pedal edema. Gastrointestinal system: Abdomen soft, non-tender, non-distended, with normoactive bowel sounds. No organomegaly or masses felt. Central nervous system: Alert and oriented. No focal neurological deficits. Extremities: Warm, no deformities Skin: No rashes, lesions, ulcers Psychiatry: Judgement and insight appear impaired. Not responding to internal stimuli. Denies hallucinations. No SI/HI. Mood & affect  appropriate.   Data Reviewed: I have personally reviewed following labs and imaging studies  CBC:  Recent Labs Lab 07/15/16 0314 07/16/16 0359 07/17/16 0319 07/18/16 0325 07/19/16 0332  WBC 11.9* 10.6* 8.9 6.9 6.7  HGB 15.1 16.5 15.0 14.5 14.8  HCT 42.0 45.7 43.2 41.4 42.1  MCV 98.4 98.9 102.1* 100.7* 99.8  PLT 123* 107* 117* 157 220   Basic Metabolic Panel:  Recent Labs Lab 07/15/16 0314 07/16/16 0359 07/16/16 1709 07/17/16 0319 07/18/16 0325 07/19/16 0332  NA 136 139  --  139 137 137  K 3.4* 3.9  --  3.7 3.5 3.6  CL 105 106  --  107 107 106  CO2 20* 21*  --  23 23 22   GLUCOSE 108* 74  --  95 100* 88  BUN 15 12  --  17 13 10   CREATININE 1.19 0.84  --  0.72 0.70 0.73  CALCIUM 9.3 9.3  --  9.1 9.2 9.5  MG 1.4* 2.1 2.2 1.9  --   --   PHOS 2.8 2.4* 3.1 2.9  --   --    GFR: Estimated Creatinine Clearance: 178.1 mL/min (by C-G formula based on SCr of 0.73 mg/dL). Liver Function Tests:  Recent Labs Lab 07/14/16 1957 07/16/16 0359 07/17/16 0319  AST 136* 201* 134*  ALT 131* 125* 97*  ALKPHOS 115 88 79  BILITOT 4.4* 3.9* 5.1*  PROT 8.6* 7.7 6.9  ALBUMIN 4.5 3.9 3.4*   No results for input(s): LIPASE, AMYLASE in the last 168 hours.  Recent Labs Lab 07/14/16 2217  AMMONIA 54*   Coagulation Profile:  Recent Labs Lab 07/14/16 1956  INR 1.03   Cardiac Enzymes: No results for input(s): CKTOTAL, CKMB, CKMBINDEX, TROPONINI in the last 168 hours. BNP (last 3 results) No results for input(s): PROBNP in the last 8760 hours. HbA1C: No results for input(s): HGBA1C in the last 72 hours. CBG:  Recent Labs Lab 07/17/16 1206 07/17/16 1546 07/17/16 2038 07/17/16 2346 07/18/16 0351  GLUCAP 75 85 83 113* 94   Lipid Profile: No results for input(s): CHOL, HDL, LDLCALC, TRIG, CHOLHDL, LDLDIRECT in the last 72 hours. Thyroid Function Tests: No results for input(s): TSH, T4TOTAL, FREET4, T3FREE, THYROIDAB in the last 72 hours. Anemia Panel: No results for  input(s): VITAMINB12, FOLATE, FERRITIN, TIBC, IRON, RETICCTPCT in the last 72 hours. Urine analysis: No results found for: COLORURINE, APPEARANCEUR, LABSPEC, PHURINE, GLUCOSEU, HGBUR, BILIRUBINUR, KETONESUR, PROTEINUR, UROBILINOGEN, NITRITE, LEUKOCYTESUR Sepsis Labs: @LABRCNTIP (procalcitonin:4,lacticidven:4)  ) Recent Results (from the past 240 hour(s))  MRSA PCR Screening     Status: None   Collection Time: 07/15/16  5:00 AM  Result Value Ref Range Status   MRSA by PCR NEGATIVE NEGATIVE Final    Comment:        The GeneXpert MRSA Assay (FDA approved for NASAL specimens only), is one component of a comprehensive MRSA colonization surveillance program. It is not intended to diagnose MRSA infection nor to guide or monitor treatment for MRSA infections.      Radiology Studies: Dg Chest Port 1 View  Result Date: 07/18/2016 CLINICAL DATA:  Acute respiratory failure  EXAM: PORTABLE CHEST 1 VIEW COMPARISON:  07/17/2016 FINDINGS: Endotracheal tube removed.  Gastric tube removed. Improved lung volume. Improvement in bibasilar atelectasis since yesterday. Negative for heart failure or edema. No pleural effusion. IMPRESSION: Improved lung volume. Improvement in bibasilar atelectasis following extubation. Electronically Signed   By: Marlan Palau M.D.   On: 07/18/2016 07:25    Scheduled Meds: . enoxaparin (LOVENOX) injection  40 mg Subcutaneous Q24H  . folic acid  1 mg Oral Daily  . multivitamin with minerals  1 tablet Oral Daily  . pantoprazole  40 mg Oral Daily  . thiamine  100 mg Oral Daily   Continuous Infusions:    LOS: 4 days   Time spent: 25 minutes.  Hazeline Junker, MD [facility] Pager [phone]  If 7PM-7AM, please contact night-coverage [email or web address] Password [email or web address] 07/19/2016, 1:26 PM

## 2016-07-19 NOTE — Progress Notes (Signed)
Nutrition Follow-up  DOCUMENTATION CODES:   Obesity unspecified  INTERVENTION:  - Continue to encourage PO intakes of meals. - RD will continue to monitor for additional nutrition-related needs.  NUTRITION DIAGNOSIS:   Inadequate oral intake related to inability to eat as evidenced by NPO status. -resolved No new nutrition dx at this time.   GOAL:   Patient will meet greater than or equal to 90% of their needs -met  MONITOR:   PO intake, Weight trends, Labs, I & O's  ASSESSMENT:   10M with PMH significant for hypertension, hypothyroidism and alcohol abuse/dependence who presents with acute withdrawal symptoms and desire to detox. He has a long history of problems with alcohol and was able to quit last January for about a month without any major complications. Over the last 6+ months he has gone back to drinking heavily - mostly hard liquor (Fireball), but also beer - 30+ oz daily. He has stopped attending to his other medical issues during this time as well, and has been non-adherent to therapy for thyroid disease and hypertension. He decided to quit drinking on 10/8. He reportedly has not had any alcohol since that day.   10/19 Diet advancement as follows: 10/17 @ 4765: NPO 10/17 @ 1500: FLD 10/17 @ 4650: Regular  Per chart review, pt consumed 100% of dinner on 10/17 and no other PO intakes documented since that time. Pt reports good appetite PTA, even with drinking, and since diet advancement. He states that he was staying with his parents for several days PTAa nd his mom was making sure that he ate well for each meal. He denies any abdominal pain or nausea with PO intakes over the past few days. He reports that tremors have significantly decreased and he is not having any issues with gripping cups or utensils.   Per rounds, pt would like to go to a rehab program after d/c. Pt also expresses this to RD stating that he would like to join AA and go to a 3-day inpatient rehab  program.   Weight remains stable since admission. Pt states that he greatly enjoys fruit and asks if he needs to avoid certain fruits d/t sugar content. He denies hx of elevated blood sugars or needing to monitor blood sugars for any reason. Informed pt that he does not need to limit any fruit, but reminded him of the need to consume a variety of foods and not to eat fruit in place of other food groups.  Medications reviewed; 1 mg oral folic acid/day, 354 mg oral thiamine/day, 40 mg oral Protonix/day, daily multivitamin with minerals.  Labs reviewed; no LFTs yesterday or today.    10/17 - Pt extubated this AM and NGT removed at that time.  - Estimated nutrition needs updated based on extubation.  - Pt sleeping at this time with no family/visitors present.  - Weight remains stable.     10/16 - Pt with NGT to suction with 50cc drainage present at time of RD visit.  - No family/visitors at bedside.  - Weight stable from yesterday. - TF consult placed and order placed by this RD for 60 mL Prostat QID (800 kcal, 120 grams of protein).  Patient is currently intubated on ventilator support MV: 11.7L/min Temp (24hrs), Avg:98 F (36.7 C), Min:97.5 F (36.4 C), Max:98.5 F (36.9 C) Propofol: 35.88m/hr (929 kcal) Drip: Propofol @ 50 mcg/kg/min.    Diet Order:  Diet regular Room service appropriate? Yes; Fluid consistency: Thin  Skin:  Reviewed, no issues  Last BM:  10/15  Height:   Ht Readings from Last 1 Encounters:  07/16/16 _0  (1.88 m)    Weight:   Wt Readings from Last 1 Encounters:  07/19/16 256 lb 9.9 oz (116.4 kg)    Ideal Body Weight:  86.4 kg  BMI:  Body mass index is 32.95 kg/m.  Estimated Nutritional Needs:   Kcal:  2100-2300  Protein:  115-125 grams  Fluid:  >/= 2 L/day  EDUCATION NEEDS:   No education needs identified at this time    Jarome Matin, MS, RD, LDN Inpatient Clinical Dietitian Pager # [phone] After hours/weekend pager #  [phone]

## 2016-07-19 NOTE — Progress Notes (Signed)
Patient received from ICU to room 1425. Agree with ICU RN's assessment. Patient and sister oriented to room and unit.

## 2016-07-20 MED ORDER — ATORVASTATIN CALCIUM 40 MG PO TABS: 40.0000 mg | ORAL_TABLET | Freq: Every day | ORAL | 0 refills | Status: DC

## 2016-07-20 MED ORDER — HYDROCOD POLST-CPM POLST ER 10-8 MG/5ML PO SUER
5.0000 mL | Freq: Once | ORAL | Status: AC
  Administered 2016-07-20: 5 mL via ORAL
  Filled 2016-07-20: qty 5

## 2016-07-20 MED ORDER — LEVOTHYROXINE SODIUM 100 MCG PO TABS: 100.0000 ug | ORAL_TABLET | Freq: Every day | ORAL | 0 refills | Status: DC

## 2016-07-20 MED ORDER — TRAZODONE HCL 100 MG PO TABS: 100.0000 mg | ORAL_TABLET | Freq: Every evening | ORAL | 0 refills | Status: DC | PRN

## 2016-07-20 MED ORDER — METOPROLOL SUCCINATE ER 50 MG PO TB24: 50.0000 mg | ORAL_TABLET | Freq: Every day | ORAL | 0 refills | Status: DC

## 2016-07-20 MED FILL — METOPROLOL SUCC ER 50 MG TA: 50 | 30 days supply | Qty: 30 | Fill #0

## 2016-07-20 MED FILL — traZODone HCL 100 MG TABS: 100 | 14 days supply | Qty: 14 | Fill #0

## 2016-07-20 MED FILL — ATORVASTATIN 40 MG TABLET: 40 | 30 days supply | Qty: 30 | Fill #0

## 2016-07-20 MED FILL — LEVOTHYROXINE 100 MCG TAB: 100 | 30 days supply | Qty: 30 | Fill #0

## 2016-07-20 NOTE — Discharge Summary (Signed)
Physician Discharge Summary  Raymond Gomez ZOX:[id number] DOB: Jul 26, 1981 DOA: 07/14/2016  PCP: No primary care provider on file.  Admit date: 07/14/2016 Discharge date: 07/20/2016  Admitted From: Home Disposition: Home with parents   Recommendations for Outpatient Follow-up:  1. Follow up with PCP in 1-2 weeks. 2. Follow up alcohol cessation, pt discharged to home with parents and requests to find facility for further recovery on his own.  Home Health: None Equipment/Devices: None  Discharge Condition: Stable CODE STATUS: Full Diet recommendation: Regular, avoidant of all substances.   Brief/Interim Summary: Raymond Gomez is a 35 y.o. male with a history of alcohol abuse/dependence, hypothyroidism and HTN admitted 10/15 with acute alcohol withdrawal desiring detox. He has a long history of alcohol use and problem drinking, but had never required medical assistance with withdrawal. He last quit for 30 days in Jan 2017, but went back to heavily drinking a total of 30 alcoholic beverages (beer, shots, cocktails) daily. He also took no medications during this time and decided to quit drinking with his last drink on 10/8. He developed worsening tremor, agitation, and audiovisual hallucinations 48 hours prior to admission prompting his parents to convince him to go to the ED.On arrival he was agitated, disoriented, diaphoretic and hallucinating. He decompensated due to agitated delirium and withdrawal symptoms, requiring intubation. He was extubated 10/17, required precedex for withdrawal symptoms which was discontinued 10/18 as CIWA protocol was initiated. CCM transferred care to [facility] 10/19. He has done well with CIWA scores not requiring prn ativan since 11/17. Scores have been 0, 2, 2, 2, 0, 2 in the 24 hours prior to discharge with no benzodiazepines given. The patient and his parents, with whom he lives, are in agreement with the recommendation for inpatient recovery  services on discharge. LCSWA provided patient with a list of facilities in [place] and a [facility] Pamphlet for reference.   Discharge Diagnoses:  Active Problems:   Alcohol withdrawal (HCC)   Acute respiratory failure with hypoxemia [facility])  Discharge Instructions Discharge Instructions    Discharge instructions    Complete by:  As directed    You were admitted for severe alcohol withdrawal with DT's. This has since resolved and you are medically stable for discharge. Make sure you have a solid plan to seek recovery services as soon as possible. You may investigate on your own, of course, and can use the resources provided to you by social work.   Of course, abstain from alcohol and all illicit substances.   If you notice any new or worsening symptoms please seek medical care right away.       Medication List    STOP taking these medications   diphenhydrAMINE 25 MG tablet Commonly known as:  SOMINEX     TAKE these medications   atorvastatin 40 MG tablet Commonly known as:  LIPITOR Take 40 mg by mouth daily.   levothyroxine 100 MCG tablet Commonly known as:  SYNTHROID, LEVOTHROID Take 100 mcg by mouth daily.   metoprolol succinate 50 MG 24 hr tablet Commonly known as:  TOPROL-XL Take 50 mg by mouth daily.   vitamin C 500 MG tablet Commonly known as:  ASCORBIC ACID Take 1,000 mg by mouth daily.   Vitamin D3 5000 units Caps Take 5,000 Units by mouth daily.       No Known Allergies  Consultations:  Critical care medicine  Procedures/Studies: Dg Chest Port 1 View  Result Date: 07/18/2016 CLINICAL DATA:  Acute respiratory failure EXAM:  PORTABLE CHEST 1 VIEW COMPARISON:  07/17/2016 FINDINGS: Endotracheal tube removed.  Gastric tube removed. Improved lung volume. Improvement in bibasilar atelectasis since yesterday. Negative for heart failure or edema. No pleural effusion. IMPRESSION: Improved lung volume. Improvement in bibasilar atelectasis  following extubation. Electronically Signed   By: Marlan Palauharles  Clark M.D.   On: 07/18/2016 07:25   Dg Chest Port 1 View  Result Date: 07/17/2016 CLINICAL DATA:  Respiratory failure. EXAM: PORTABLE CHEST 1 VIEW COMPARISON:  07/16/2016. FINDINGS: Endotracheal tube and NG tube in stable position. Low lung volumes with bibasilar atelectasis. Stable cardiomegaly. No pleural effusion or pneumothorax. IMPRESSION: 1. Lines and tubes in stable position. 2. Low lung volumes with bibasilar atelectasis. 3. Stable cardiomegaly . Electronically Signed   By: Maisie Fushomas  Register   On: 07/17/2016 07:30   Dg Chest Port 1 View  Result Date: 07/16/2016 CLINICAL DATA:  Respiratory failure. EXAM: PORTABLE CHEST 1 VIEW COMPARISON:  07/15/2016 . FINDINGS: Endotracheal tube in stable position. Lower hemidiaphragms not completely imaged. NG tube tip not identified. Low lung volumes with right base atelectasis. Stable cardiomegaly. IMPRESSION: 1. Endotracheal tube in stable position. Lower hemidiaphragms not completely imaged. NG tube tip not identified. 2.  Stable cardiomegaly. 3. Low lung volumes with right base atelectasis . Electronically Signed   By: Maisie Fushomas  Register   On: 07/16/2016 07:17   Dg Chest Portable 1 View  Result Date: 07/15/2016 CLINICAL DATA:  Endotracheal tube placement. EXAM: PORTABLE CHEST 1 VIEW COMPARISON:  None. FINDINGS: Endotracheal tube 5.1 cm from the carina. Enteric tube in place tube below the diaphragm, side-port tentatively identified in the distal esophagus. Lung volumes are low. Heart size likely normal for technique and inspiration. Mild bibasilar atelectasis. No pleural fluid or pneumothorax. No acute osseous abnormality is seen. IMPRESSION: 1. Endotracheal tube 5.1 cm from the carina. Enteric tube in place, side-port likely in the distal esophagus. Advancement of at least 6 cm would place the side-port below the diaphragm. 2. Low lung volumes with bibasilar atelectasis. Prominent heart size likely  accentuated by technique. Electronically Signed   By: Rubye OaksMelanie  Ehinger M.D.   On: 07/15/2016 02:22   [place] Abdomen Limited Ruq  Result Date: 07/15/2016 CLINICAL DATA:  Abnormal liver function tests EXAM: US ABDOMEN LIMITED - RIGHT UPPER QUADRANT COMPARISON:  None. FINDINGS: Gallbladder: Sludge and 6 mm non shadowing echogenic focus near the neck. No wall thickening to suggest acute cholecystitis. Unable to assess Murphy's in this intubated patient. Common bile duct: Diameter: 6 mm Liver: Echogenic with poor acoustic penetration.  No focal finding. IMPRESSION: 1. No evidence of cholecystitis. 2. Gallbladder sludge and questionable small calculus. 3. Hepatic steatosis. Electronically Signed   By: Marnee SpringJonathon  Watts M.D.   On: 07/15/2016 16:49    Subjective: Pt feels well. Denies tremor, anxiety, diaphoresis. Slept well. No chest pain, dyspnea.  Discharge Exam: Vitals:   07/19/16 2221 07/20/16 0404  BP: (!) 150/86 134/82  Pulse: 81 81  Resp: 20 16  Temp: 99.1 F (37.3 C) 98.4 F (36.9 C)   Vitals:   07/19/16 1428 07/19/16 1600 07/19/16 2221 07/20/16 0404  BP: (!) 142/75 134/77 (!) 150/86 134/82  Pulse: (!) 107 97 81 81  Resp: (!) 21 20 20 16   Temp:  98.4 F (36.9 C) 99.1 F (37.3 C) 98.4 F (36.9 C)  TempSrc:  Oral Oral Oral  SpO2: 97% 98% 96% 99%  Weight:      Height:       General: Pt is alert, awake, not in acute  distress Cardiovascular: RRR, S1/S2 +, no rubs, no gallops Respiratory: CTA bilaterally, no wheezing, no rhonchi Abdominal: Soft, NT, ND, bowel sounds + Extremities: no edema, no cyanosis Psych: Alert, oriented. Talkative without flight of ideas, tangentiality, or grandiosity. No SI/HI, denies AV hallucinations.   The results of significant diagnostics from this hospitalization (including imaging, microbiology, ancillary and laboratory) are listed below for reference.    Labs: Basic Metabolic Panel:  Recent Labs Lab 07/15/16 0314 07/16/16 0359 07/16/16 1709  07/17/16 0319 07/18/16 0325 07/19/16 0332  NA 136 139  --  139 137 137  K 3.4* 3.9  --  3.7 3.5 3.6  CL 105 106  --  107 107 106  CO2 20* 21*  --  23 23 22   GLUCOSE 108* 74  --  95 100* 88  BUN 15 12  --  17 13 10   CREATININE 1.19 0.84  --  0.72 0.70 0.73  CALCIUM 9.3 9.3  --  9.1 9.2 9.5  MG 1.4* 2.1 2.2 1.9  --   --   PHOS 2.8 2.4* 3.1 2.9  --   --    Liver Function Tests:  Recent Labs Lab 07/14/16 1957 07/16/16 0359 07/17/16 0319  AST 136* 201* 134*  ALT 131* 125* 97*  ALKPHOS 115 88 79  BILITOT 4.4* 3.9* 5.1*  PROT 8.6* 7.7 6.9  ALBUMIN 4.5 3.9 3.4*    Recent Labs Lab 07/14/16 2217  AMMONIA 54*   CBC:  Recent Labs Lab 07/15/16 0314 07/16/16 0359 07/17/16 0319 07/18/16 0325 07/19/16 0332  WBC 11.9* 10.6* 8.9 6.9 6.7  HGB 15.1 16.5 15.0 14.5 14.8  HCT 42.0 45.7 43.2 41.4 42.1  MCV 98.4 98.9 102.1* 100.7* 99.8  PLT 123* 107* 117* 157 220   CBG:  Recent Labs Lab 07/17/16 1206 07/17/16 1546 07/17/16 2038 07/17/16 2346 07/18/16 0351  GLUCAP 75 85 83 113* 94   Microbiology Recent Results (from the past 240 hour(s))  MRSA PCR Screening     Status: None   Collection Time: 07/15/16  5:00 AM  Result Value Ref Range Status   MRSA by PCR NEGATIVE NEGATIVE Final    Comment:        The GeneXpert MRSA Assay (FDA approved for NASAL specimens only), is one component of a comprehensive MRSA colonization surveillance program. It is not intended to diagnose MRSA infection nor to guide or monitor treatment for MRSA infections.     Time coordinating discharge: Over 30 minutes  Hazeline Junker, MD  [facility] 07/20/2016, 1:22 PM Pager [phone]  If 7PM-7AM, please contact night-coverage [email or web address] Password [email or web address]

## 2016-07-20 NOTE — Clinical Social Work Note (Signed)
Clinical Social Work Assessment  Patient Details  Name: Raymond Gomez MRN: [id number] Date of Birth: 12-Feb-1981  Date of referral:  07/20/16               Reason for consult:  Substance Use/ETOH Abuse                Permission sought to share information with:  Family Supports, Chartered certified accountant granted to share information::     Name::        Agency::     Relationship::     Contact Information:     Housing/Transportation Living arrangements for the past 2 months:  Single Family Home Source of Information:  Patient Patient Interpreter Needed:  None Criminal Activity/Legal Involvement Pertinent to Current Situation/Hospitalization:  No - Comment as needed Significant Relationships:  Parents, Siblings Lives with:  Self Do you feel safe going back to the place where you live?  Yes Need for family participation in patient care:  Yes (Comment)  Care giving concerns:  Patient is concerned about his drinking and inability to quit without professional support. The patient reports since th summer he has been drinking heavily everyday for the six months. Patient reports he has never drank as much in his entire life.  He reports drinking hard liquor and 30 oz or more daily. The patient reports he wants to go a treatment facility in [place], [place] away from friends.   Social Worker assessment / plan: LCSWA met with patient at bedside, explained reason for consult. Patient agreeable to assessment. The patient reports he has been drinking heavily everyday. 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 The patient reports the change in his drinking happen so easily it turned into a habit. The patient reports quit drinking in January for a month, but when he attempted to stop this time developed tremors, hallucinations auditory and visual.  The patient is open to inpatient rehab at this time. The patient reports he has been looking at facilities in [place]  and places outside of [place]. Patient reports his mother has been supporting and assisting  with the process. The patient was receptive to resources provided support.  LCSWA provided patient with a list of facilities in [place] and a [place] for reference.   Employment status:  [employer] information:  Other (Comment Required) Nurse, mental health) PT Recommendations:  Not assessed at this time Information / Referral to community resources:  Outpatient Substance Abuse Treatment Options, Residential Substance Abuse Treatment Options  Patient/Family's Response to care: Agreeable to care.   Patient/Family's Understanding of and Emotional Response to Diagnosis, Current Treatment, and Prognosis: " I want to go to treatment facility  where I can get away, and my parents can come and visit. "  Emotional Assessment Appearance:  Appears stated age Attitude/Demeanor/Rapport:    Affect (typically observed):  Accepting, Pleasant, Hopeful Orientation:  Oriented to Self, Oriented to Place, Oriented to  Time, Oriented to Situation Alcohol / Substance use:  Alcohol Use Psych involvement (Current and /or in the community):  No  Discharge Needs  Concerns to be addressed:  Substance Abuse Concerns Readmission within the last 30 days:  No Current discharge risk:  Substance Abuse Barriers to Discharge:  Active Substance Use   Lia Hopping, LCSW 07/20/2016, 10:37 AM

## 2017-02-19 DIAGNOSIS — Z658 Other specified problems related to psychosocial circumstances: Secondary | ICD-10-CM | POA: Diagnosis not present

## 2017-02-19 DIAGNOSIS — I10 Essential (primary) hypertension: Secondary | ICD-10-CM | POA: Diagnosis not present

## 2017-02-19 DIAGNOSIS — Z0000 Encounter for general adult medical examination without abnormal findings: Secondary | ICD-10-CM | POA: Diagnosis not present

## 2017-02-19 DIAGNOSIS — E784 Other hyperlipidemia: Secondary | ICD-10-CM | POA: Diagnosis not present

## 2017-02-19 DIAGNOSIS — E038 Other specified hypothyroidism: Secondary | ICD-10-CM | POA: Diagnosis not present

## 2017-02-19 DIAGNOSIS — Z1389 Encounter for screening for other disorder: Secondary | ICD-10-CM | POA: Diagnosis not present

## 2017-04-22 IMAGING — MR MR HEAD W/O CM
9 of 10 series · 38 of 48 positions shown · non-contrast
Comparison: 12/29/2015 head CT.  No comparison brain MR.

CLINICAL DATA: 34-year-old hypertensive male with high cholesterol
presenting with left arm tingling that began yesterday. Subsequent
encounter.

EXAM:
MRI HEAD WITHOUT CONTRAST
TECHNIQUE: Multiplanar, multiecho pulse sequences of the brain and surrounding
structures were obtained without intravenous contrast.

[Series 3: DWI · axial · 3.6mm · 0.94mm/px · z∈[-93,+58]mm · 9 of 88 slices shown (1 of 4)]
[im 1/88]
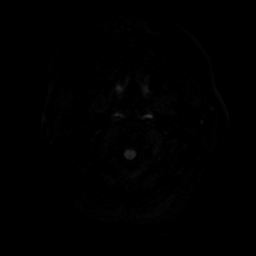
[im 11/88]
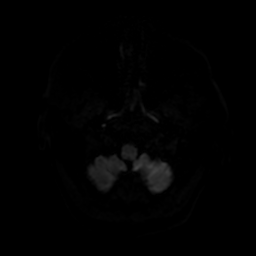
[im 22/88]
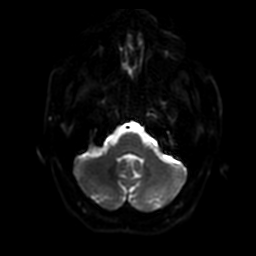
[im 33/88]
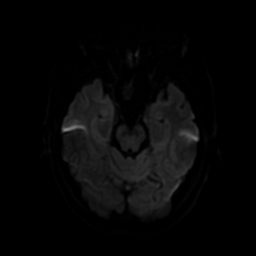
[im 44/88]
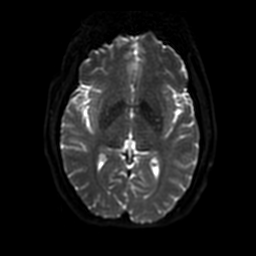
[im 55/88]
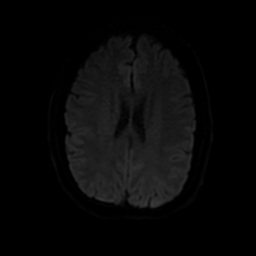
[im 66/88]
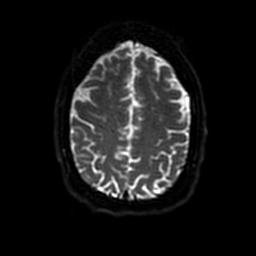
[im 77/88]
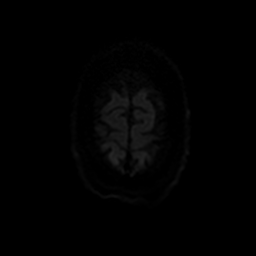
[im 88/88]
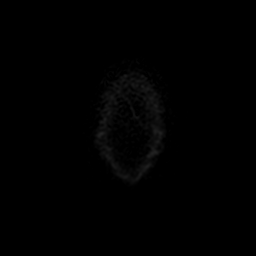

[Series 4: FLAIR · axial · 5.0mm · 0.47mm/px · z∈[-91,+56]mm · 2 of 26 slices shown (1 of 2)]
[im 1/26]
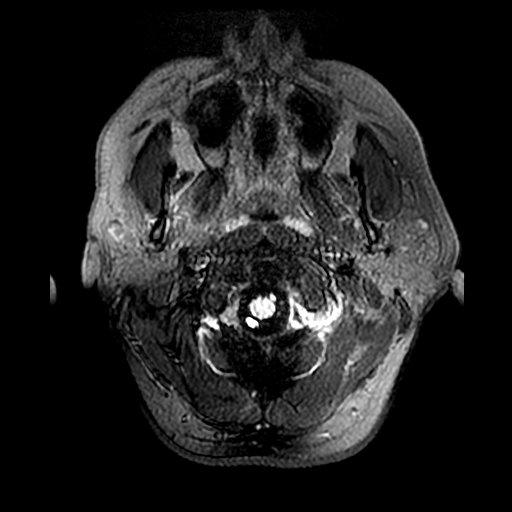
[im 26/26]
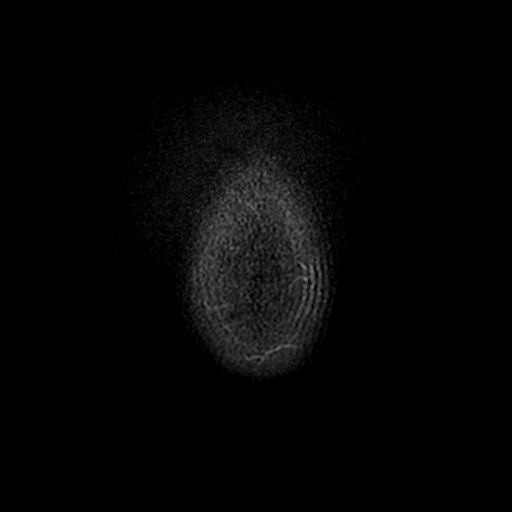

[Series 6: DWI · coronal · 5.0mm · 0.94mm/px · 7 of 78 slices shown (2 of 4)]
[im 1/78]
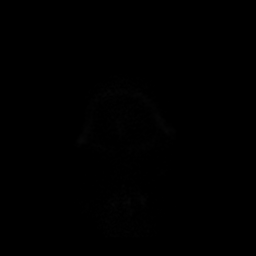
[im 13/78]
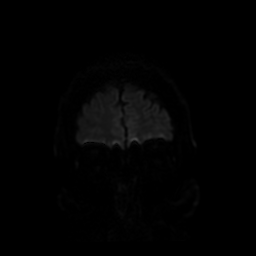
[im 26/78]
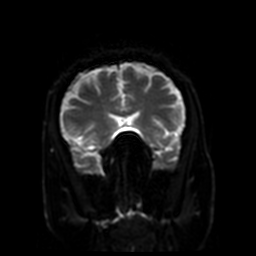
[im 39/78]
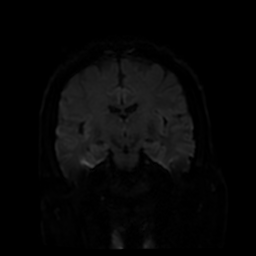
[im 52/78]
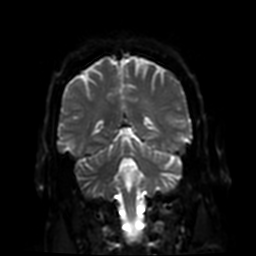
[im 65/78]
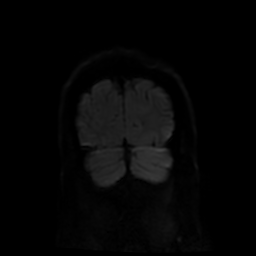
[im 78/78]
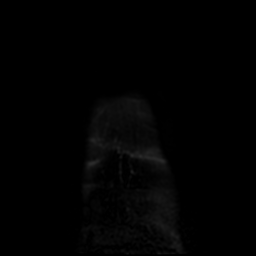

[Series 7: (person_name) · axial · 3.0mm · 0.47mm/px · z∈[-92,-8]mm · 5 of 104 slices shown]
[im 1/104]
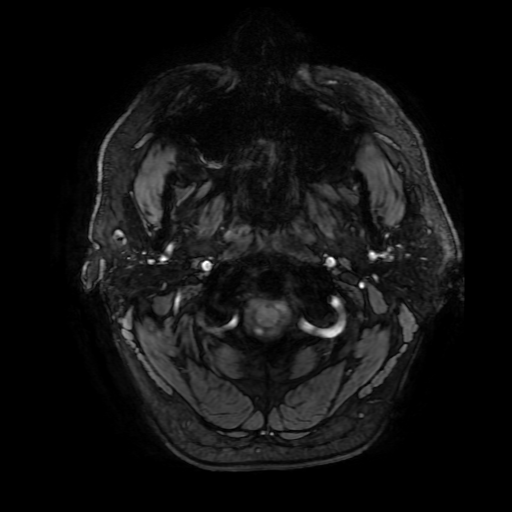
[im 12/104]
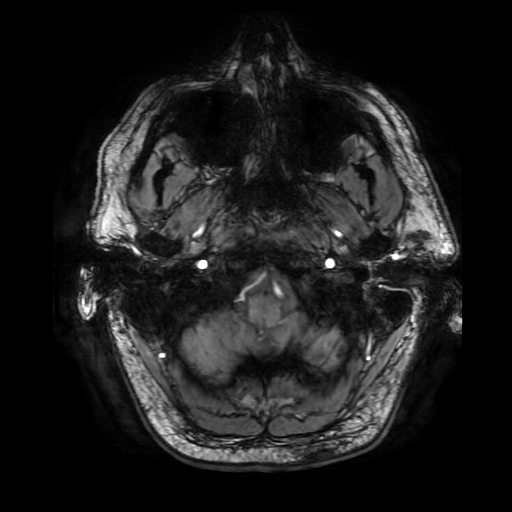
[im 35/104]
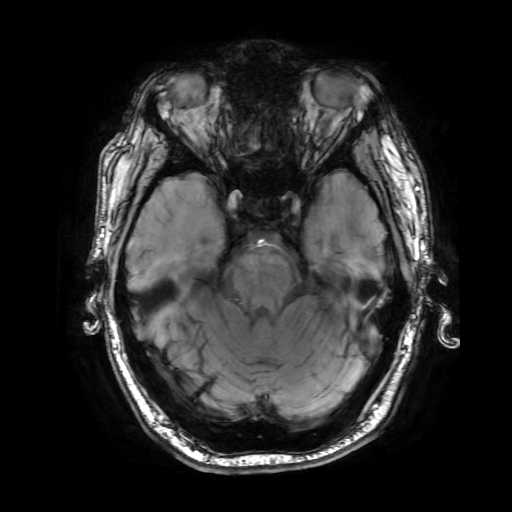
[im 46/104]
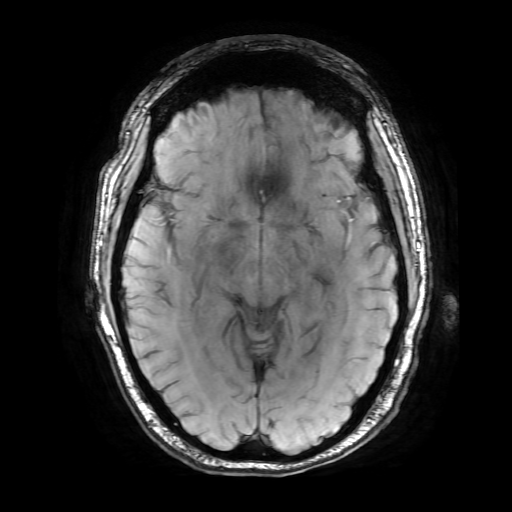
[im 58/104]
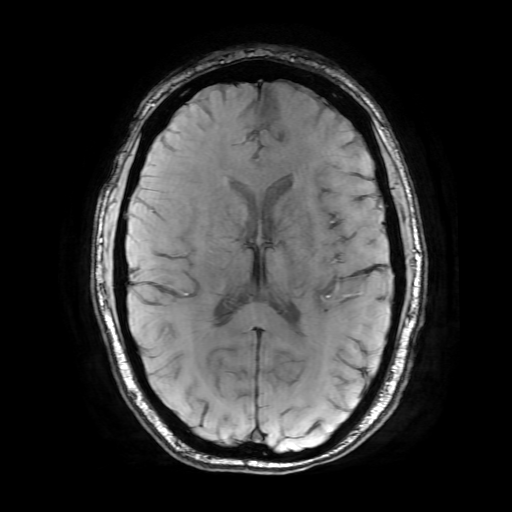

[Series 9: T2 · coronal · 5.0mm · 0.78mm/px · 3 of 33 slices shown (1 of 2)]
[im 1/33]
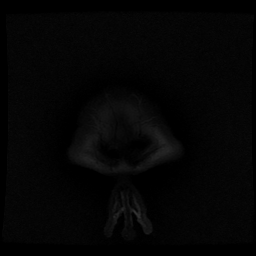
[im 17/33]
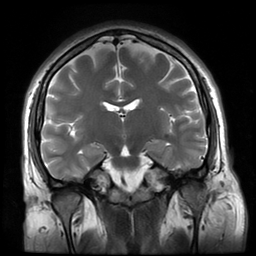
[im 33/33]
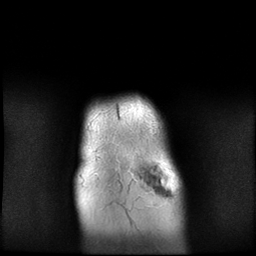

[Series 10: FLAIR · sagittal · 5.0mm · 0.47mm/px · 2 of 25 slices shown (2 of 2)]
[im 1/25]
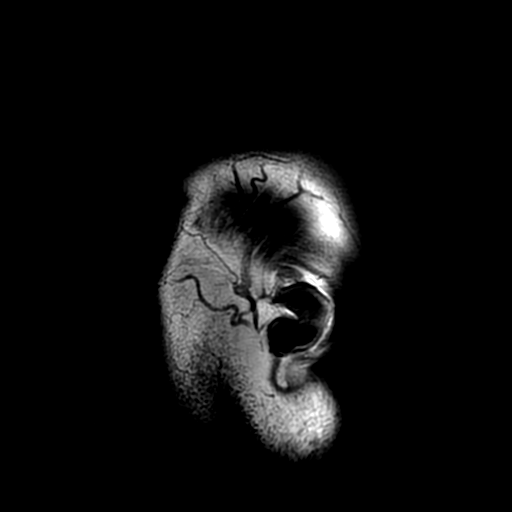
[im 25/25]
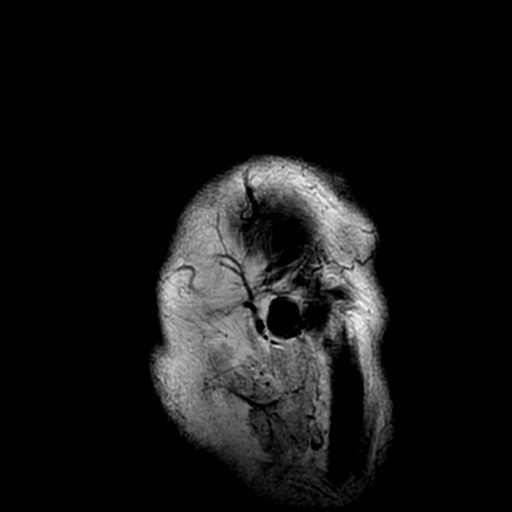

[Series 11: T2 · axial · 5.0mm · 0.47mm/px · z∈[-91,+56]mm · 2 of 26 slices shown (2 of 2)]
[im 1/26]
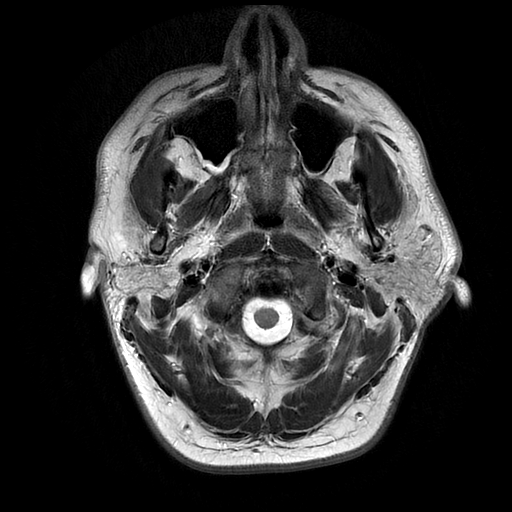
[im 26/26]
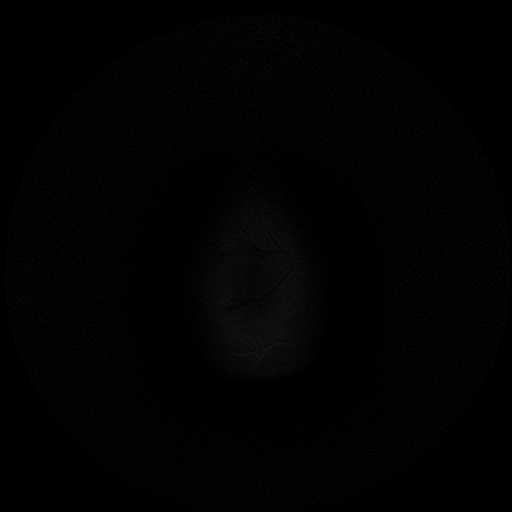

[Series 300: DWI · axial · 3.6mm · 0.94mm/px · z∈[-93,+58]mm · 4 of 44 slices shown (3 of 4)]
[im 1/44]
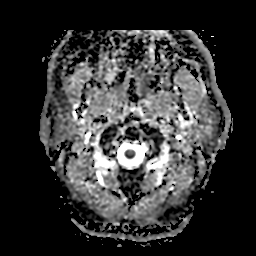
[im 15/44]
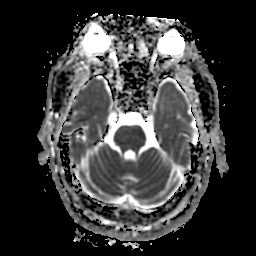
[im 29/44]
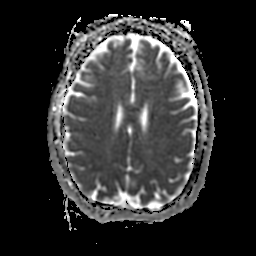
[im 44/44]
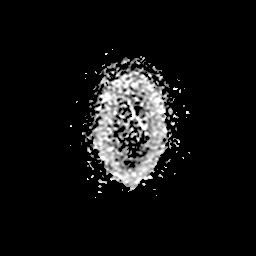

[Series 600: DWI · coronal · 5.0mm · 0.94mm/px · 4 of 39 slices shown (4 of 4)]
[im 1/39]
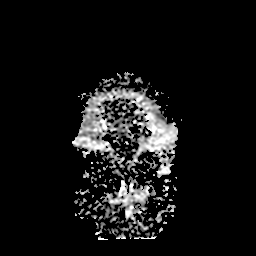
[im 13/39]
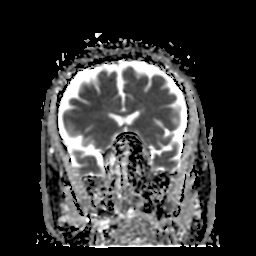
[im 26/39]
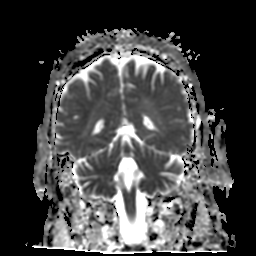
[im 39/39]
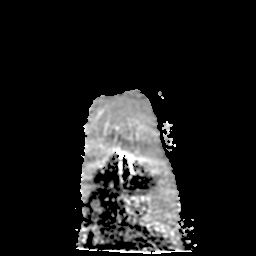

[38 of 48 positions shown; findings below may reference images not displayed]

FINDINGS: No acute infarct. On diffusion sequence, regions of artifact are
noted most notable left cerebellum.

No intracranial hemorrhage.

No intracranial mass lesion noted on this unenhanced exam.

No hydrocephalus.

Major intracranial vascular structures are patent.

Mild exophthalmos with orbital structures otherwise unremarkable.

Cervical medullary junction within normal limits.

There may be a small focal fatty deposit or tiny hemangioma within
the clivus.

Decreased signal intensity of bone marrow may be related to
patient's habitus. Correlation with CBC to exclude anemia
contributing to this appearance may be considered

Minimal to mild paranasal sinus mucosal thickening.
IMPRESSION: No acute infarct or intracranial hemorrhage.

No intracranial mass lesion noted on this unenhanced exam.

Decreased signal intensity of bone marrow may be related to
patient's habitus. Correlation with CBC to exclude anemia
contributing to this appearance may be considered

Minimal to mild paranasal sinus mucosal thickening.

## 2017-05-30 DIAGNOSIS — I10 Essential (primary) hypertension: Secondary | ICD-10-CM | POA: Diagnosis not present

## 2017-05-30 DIAGNOSIS — E038 Other specified hypothyroidism: Secondary | ICD-10-CM | POA: Diagnosis not present

## 2017-05-30 DIAGNOSIS — E784 Other hyperlipidemia: Secondary | ICD-10-CM | POA: Diagnosis not present

## 2017-05-30 DIAGNOSIS — Z6835 Body mass index (BMI) 35.0-35.9, adult: Secondary | ICD-10-CM | POA: Diagnosis not present

## 2017-07-31 DIAGNOSIS — H6002 Abscess of left external ear: Secondary | ICD-10-CM | POA: Diagnosis not present

## 2017-07-31 DIAGNOSIS — Z6834 Body mass index (BMI) 34.0-34.9, adult: Secondary | ICD-10-CM | POA: Diagnosis not present

## 2017-11-18 IMAGING — DX DG CHEST 1V PORT
1 series · 1 of 1 positions shown · non-contrast
Comparison: 07/16/2016.

CLINICAL DATA: Respiratory failure.

EXAM:
PORTABLE CHEST 1 VIEW

[chest ap]
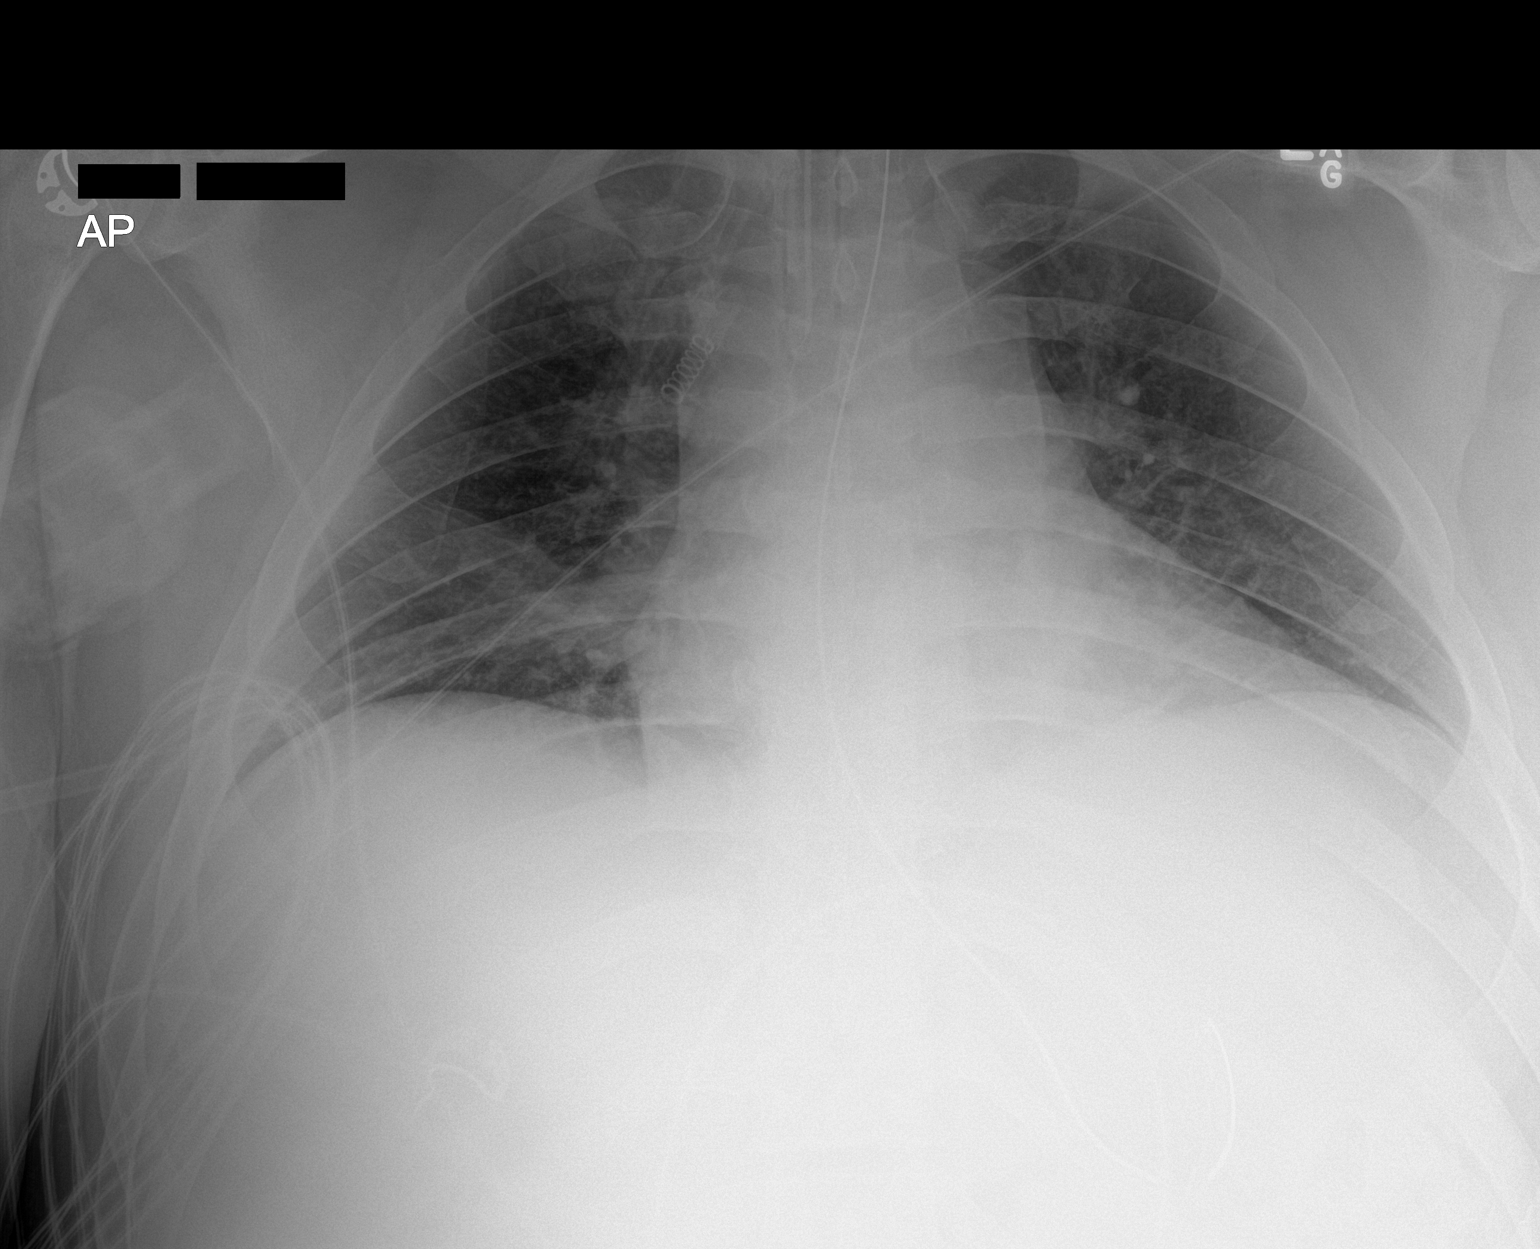

[1 of 1 positions shown; findings below may reference images not displayed]

FINDINGS: Endotracheal tube and NG tube in stable position. Low lung volumes
with bibasilar atelectasis. Stable cardiomegaly. No pleural effusion
or pneumothorax.
IMPRESSION: 1. Lines and tubes in stable position.

2. Low lung volumes with bibasilar atelectasis.

3. Stable cardiomegaly .

## 2018-03-27 IMAGING — US US ABDOMEN LIMITED
1 series · 14 of 25 positions shown · non-contrast
Comparison: None.

CLINICAL DATA: Abnormal liver function tests

EXAM:
US ABDOMEN LIMITED - RIGHT UPPER QUADRANT

[Series 1: us abdomen limited · 0.21mm/px · 14 of 64 slices shown]
[im 1/64]
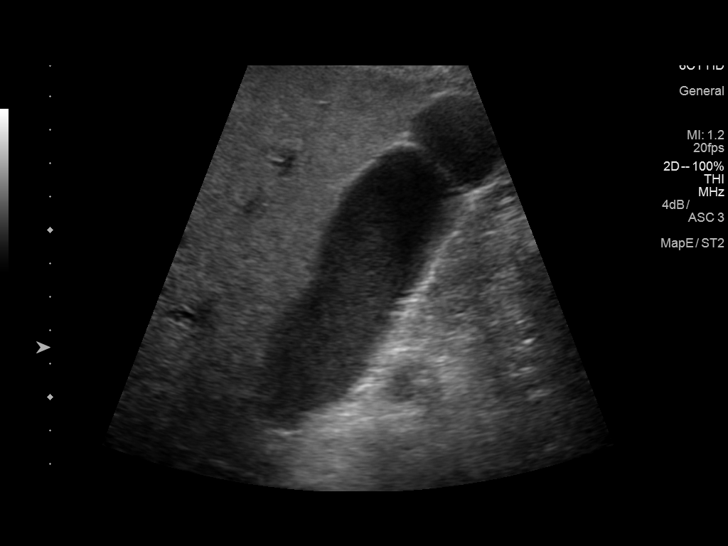
[im 6/64]
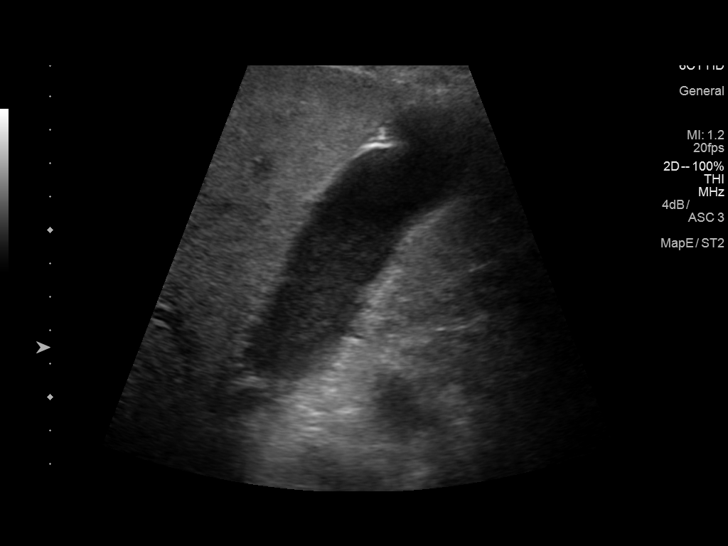
[im 11/64]
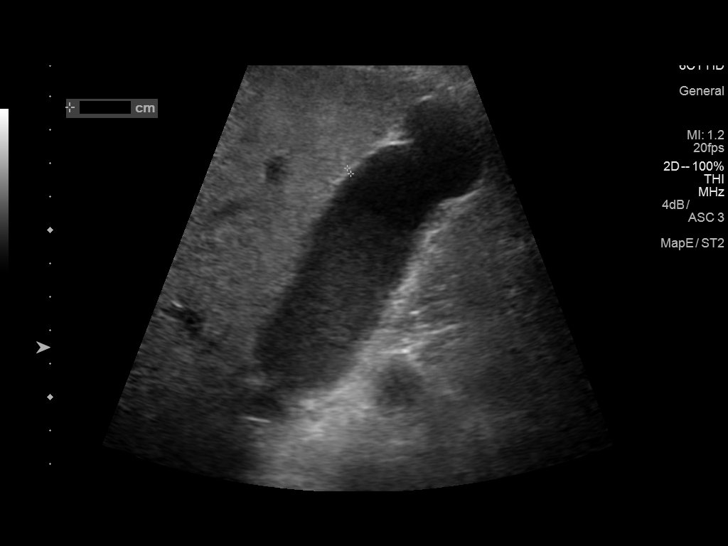
[im 16/64]
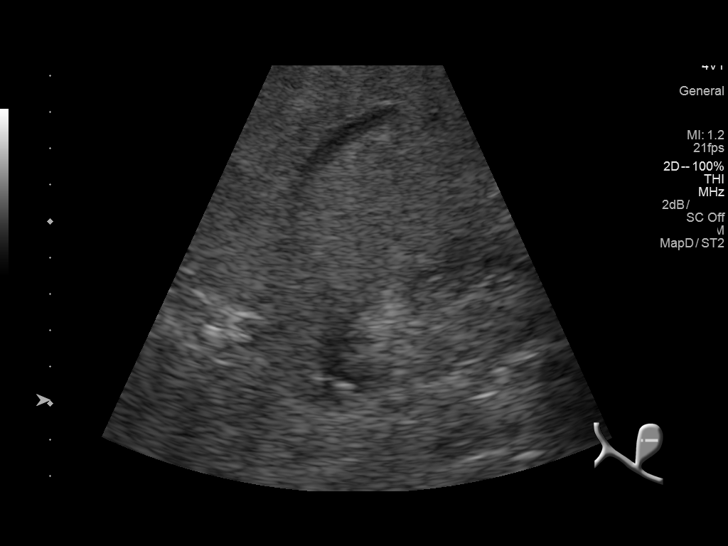
[im 22/64]
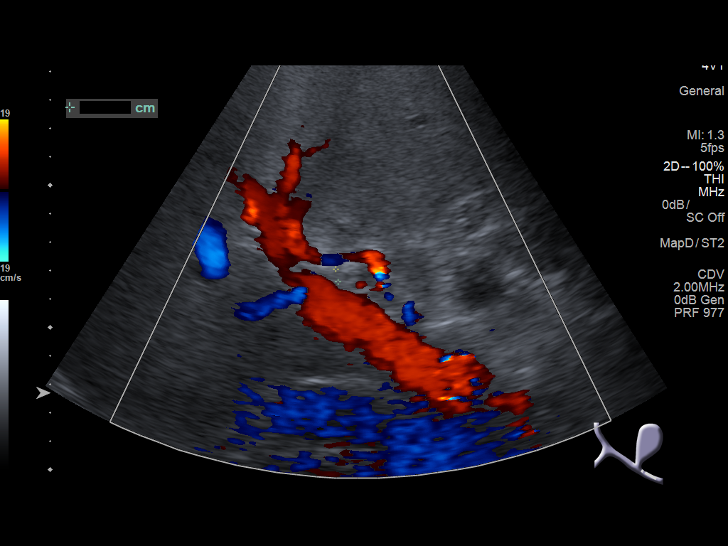
[im 24/64]
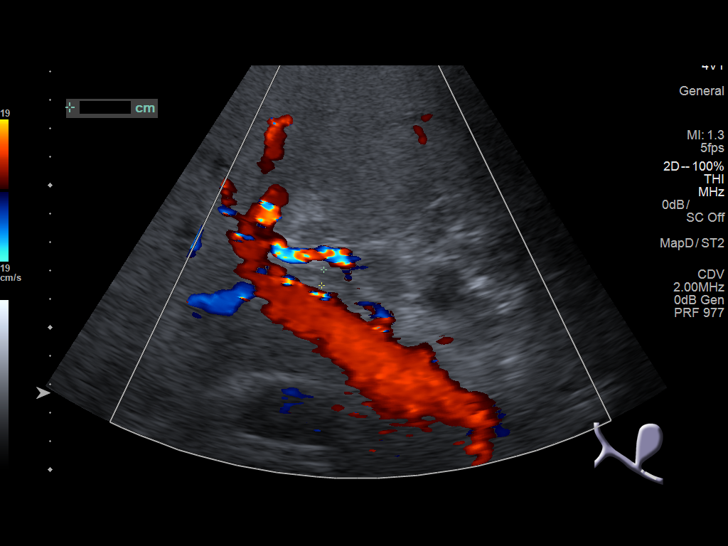
[im 29/64]
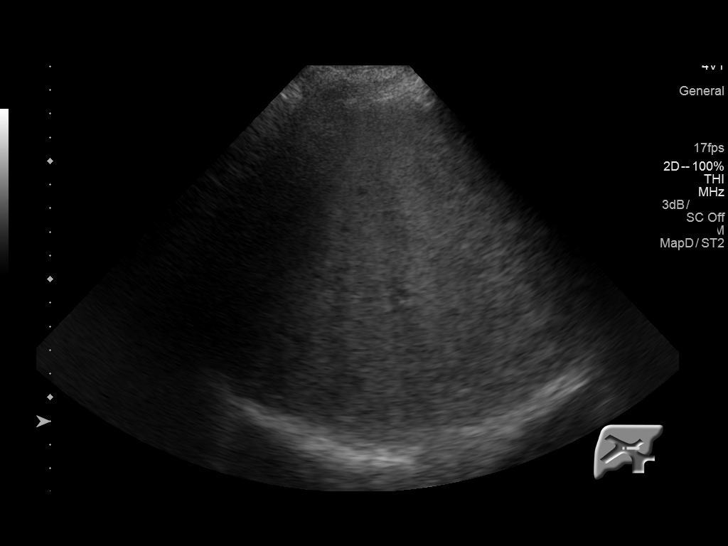
[im 35/64]
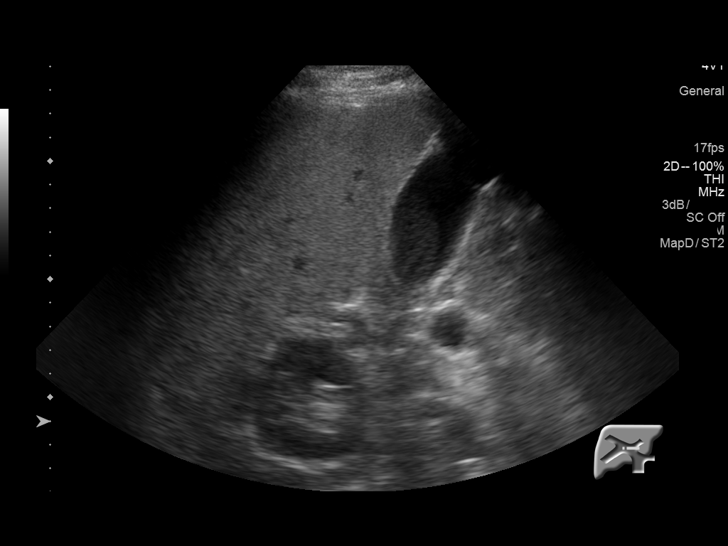
[im 40/64]
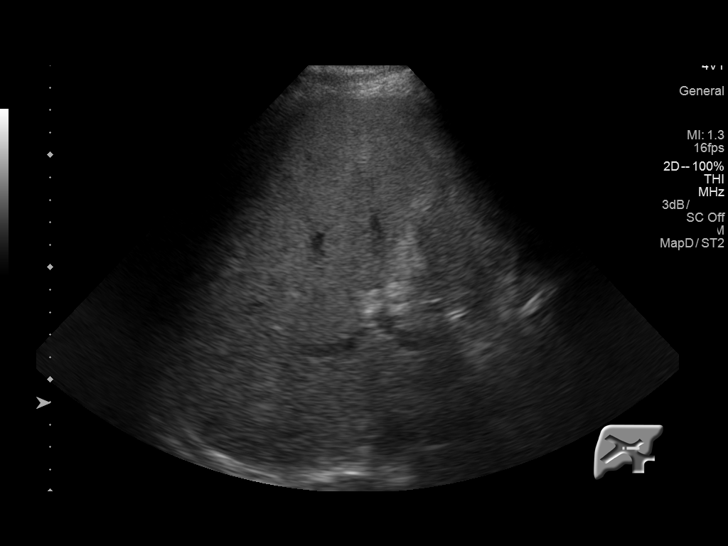
[im 43/64]
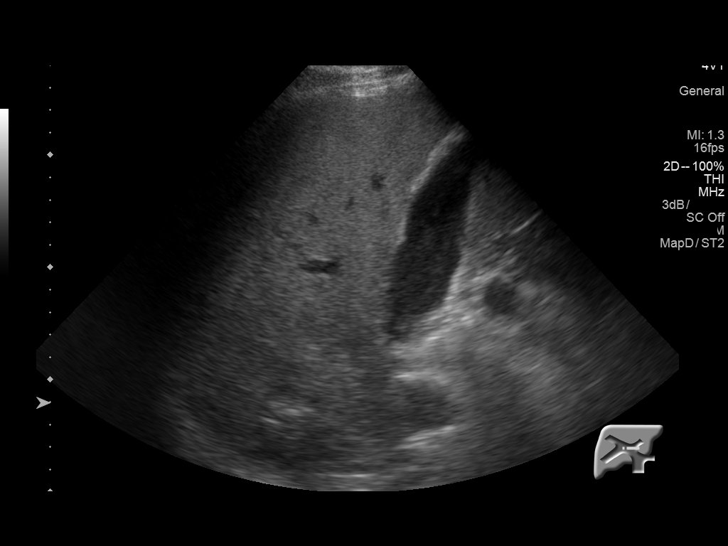
[im 48/64]
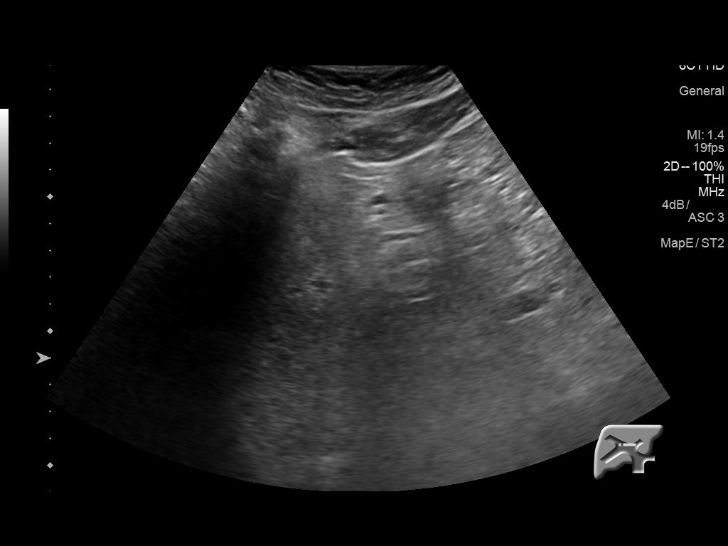
[im 53/64]
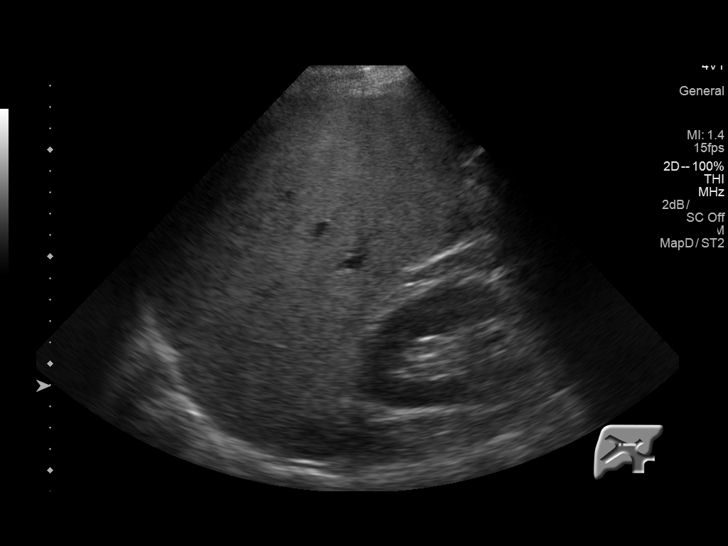
[im 58/64]
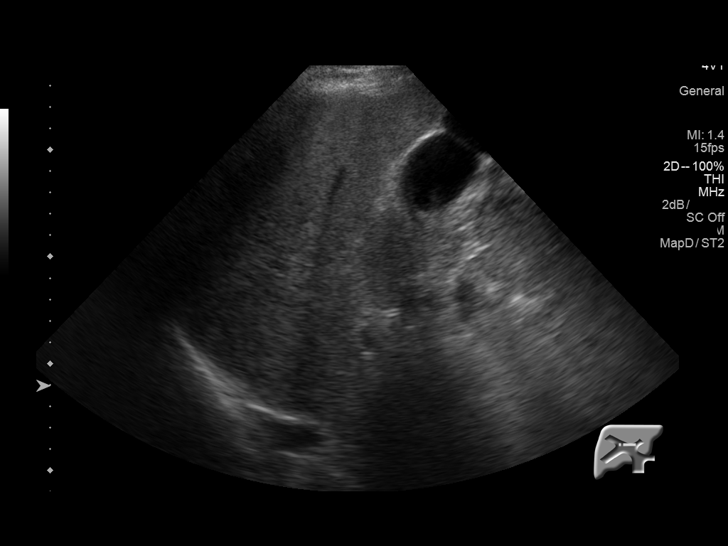
[im 64/64]
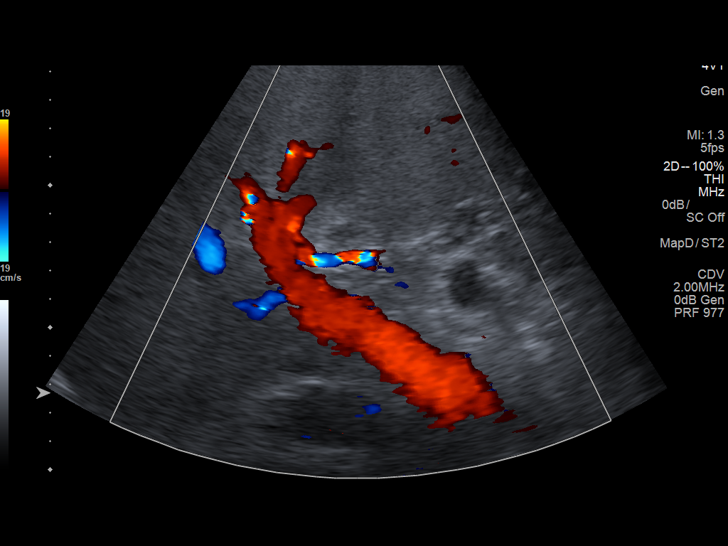

[14 of 25 positions shown; findings below may reference images not displayed]

FINDINGS: Gallbladder:

Sludge and 6 mm non shadowing echogenic focus near the neck. No wall
thickening to suggest acute cholecystitis. Unable to assess Platz
in this intubated patient.

Common bile duct:

Diameter: 6 mm

Liver:

Echogenic with poor acoustic penetration.  No focal finding.
IMPRESSION: 1. No evidence of cholecystitis.
2. Gallbladder sludge and questionable small calculus.
3. Hepatic steatosis.

## 2018-04-29 DIAGNOSIS — F411 Generalized anxiety disorder: Secondary | ICD-10-CM | POA: Diagnosis not present

## 2018-05-08 DIAGNOSIS — F411 Generalized anxiety disorder: Secondary | ICD-10-CM | POA: Diagnosis not present

## 2018-06-05 DIAGNOSIS — F419 Anxiety disorder, unspecified: Secondary | ICD-10-CM | POA: Diagnosis not present

## 2018-06-05 DIAGNOSIS — F1020 Alcohol dependence, uncomplicated: Secondary | ICD-10-CM | POA: Diagnosis not present

## 2018-06-05 DIAGNOSIS — F331 Major depressive disorder, recurrent, moderate: Secondary | ICD-10-CM | POA: Diagnosis not present

## 2019-04-30 DIAGNOSIS — Z7251 High risk heterosexual behavior: Secondary | ICD-10-CM | POA: Diagnosis not present

## 2019-04-30 DIAGNOSIS — N4829 Other inflammatory disorders of penis: Secondary | ICD-10-CM | POA: Diagnosis not present

## 2019-04-30 DIAGNOSIS — L989 Disorder of the skin and subcutaneous tissue, unspecified: Secondary | ICD-10-CM | POA: Diagnosis not present

## 2019-08-12 ENCOUNTER — Other Ambulatory Visit: Payer: Self-pay

## 2019-08-12 DIAGNOSIS — Z20822 Contact with and (suspected) exposure to covid-19: Secondary | ICD-10-CM

## 2019-08-14 LAB — NOVEL CORONAVIRUS, NAA: SARS-CoV-2, NAA: NOT DETECTED

## 2020-02-10 DIAGNOSIS — R000 Tachycardia, unspecified: Secondary | ICD-10-CM | POA: Diagnosis not present

## 2020-02-10 DIAGNOSIS — F10929 Alcohol use, unspecified with intoxication, unspecified: Secondary | ICD-10-CM | POA: Diagnosis not present

## 2020-02-10 DIAGNOSIS — R0902 Hypoxemia: Secondary | ICD-10-CM | POA: Diagnosis not present

## 2020-02-10 DIAGNOSIS — I10 Essential (primary) hypertension: Secondary | ICD-10-CM | POA: Diagnosis not present

## 2020-02-24 ENCOUNTER — Encounter (HOSPITAL_COMMUNITY): Payer: Self-pay | Admitting: Emergency Medicine

## 2020-02-24 ENCOUNTER — Emergency Department (HOSPITAL_COMMUNITY): Payer: BC Managed Care – PPO

## 2020-02-24 ENCOUNTER — Inpatient Hospital Stay (HOSPITAL_COMMUNITY)
Admission: EM | Admit: 2020-02-24 | Discharge: 2020-02-27 | DRG: 433 | Disposition: A | Discharge status: Home / Self Care | Payer: BC Managed Care – PPO | Attending: Family Medicine | Admitting: Family Medicine

## 2020-02-24 DIAGNOSIS — R946 Abnormal results of thyroid function studies: Secondary | ICD-10-CM | POA: Diagnosis present

## 2020-02-24 DIAGNOSIS — R188 Other ascites: Secondary | ICD-10-CM

## 2020-02-24 DIAGNOSIS — F1010 Alcohol abuse, uncomplicated: Secondary | ICD-10-CM | POA: Diagnosis not present

## 2020-02-24 DIAGNOSIS — E039 Hypothyroidism, unspecified: Secondary | ICD-10-CM | POA: Diagnosis present

## 2020-02-24 DIAGNOSIS — K7460 Unspecified cirrhosis of liver: Secondary | ICD-10-CM | POA: Diagnosis not present

## 2020-02-24 DIAGNOSIS — Z20822 Contact with and (suspected) exposure to covid-19: Secondary | ICD-10-CM | POA: Diagnosis present

## 2020-02-24 DIAGNOSIS — D649 Anemia, unspecified: Secondary | ICD-10-CM | POA: Diagnosis present

## 2020-02-24 DIAGNOSIS — E871 Hypo-osmolality and hyponatremia: Secondary | ICD-10-CM | POA: Diagnosis not present

## 2020-02-24 DIAGNOSIS — K7290 Hepatic failure, unspecified without coma: Secondary | ICD-10-CM | POA: Diagnosis not present

## 2020-02-24 DIAGNOSIS — E538 Deficiency of other specified B group vitamins: Secondary | ICD-10-CM | POA: Diagnosis present

## 2020-02-24 DIAGNOSIS — R17 Unspecified jaundice: Secondary | ICD-10-CM | POA: Diagnosis present

## 2020-02-24 DIAGNOSIS — Z79890 Hormone replacement therapy (postmenopausal): Secondary | ICD-10-CM

## 2020-02-24 DIAGNOSIS — D72829 Elevated white blood cell count, unspecified: Secondary | ICD-10-CM | POA: Diagnosis not present

## 2020-02-24 DIAGNOSIS — K7011 Alcoholic hepatitis with ascites: Secondary | ICD-10-CM | POA: Diagnosis not present

## 2020-02-24 DIAGNOSIS — E669 Obesity, unspecified: Secondary | ICD-10-CM | POA: Diagnosis present

## 2020-02-24 DIAGNOSIS — K7689 Other specified diseases of liver: Secondary | ICD-10-CM | POA: Diagnosis not present

## 2020-02-24 DIAGNOSIS — F17210 Nicotine dependence, cigarettes, uncomplicated: Secondary | ICD-10-CM | POA: Diagnosis present

## 2020-02-24 DIAGNOSIS — K766 Portal hypertension: Secondary | ICD-10-CM | POA: Diagnosis present

## 2020-02-24 DIAGNOSIS — K7031 Alcoholic cirrhosis of liver with ascites: Principal | ICD-10-CM

## 2020-02-24 DIAGNOSIS — F1290 Cannabis use, unspecified, uncomplicated: Secondary | ICD-10-CM | POA: Diagnosis present

## 2020-02-24 DIAGNOSIS — I10 Essential (primary) hypertension: Secondary | ICD-10-CM | POA: Diagnosis not present

## 2020-02-24 DIAGNOSIS — E7800 Pure hypercholesterolemia, unspecified: Secondary | ICD-10-CM | POA: Diagnosis present

## 2020-02-24 DIAGNOSIS — R109 Unspecified abdominal pain: Secondary | ICD-10-CM

## 2020-02-24 DIAGNOSIS — Z03818 Encounter for observation for suspected exposure to other biological agents ruled out: Secondary | ICD-10-CM | POA: Diagnosis not present

## 2020-02-24 DIAGNOSIS — D689 Coagulation defect, unspecified: Secondary | ICD-10-CM | POA: Diagnosis present

## 2020-02-24 DIAGNOSIS — Z79899 Other long term (current) drug therapy: Secondary | ICD-10-CM

## 2020-02-24 DIAGNOSIS — K8070 Calculus of gallbladder and bile duct without cholecystitis without obstruction: Secondary | ICD-10-CM | POA: Diagnosis present

## 2020-02-24 DIAGNOSIS — E876 Hypokalemia: Secondary | ICD-10-CM | POA: Diagnosis present

## 2020-02-24 LAB — COMPREHENSIVE METABOLIC PANEL
ALT: 103 U/L — ABNORMAL HIGH (ref 0–44)
AST: 167 U/L — ABNORMAL HIGH (ref 15–41)
Albumin: 2.1 g/dL — ABNORMAL LOW (ref 3.5–5.0)
Alkaline Phosphatase: 165 U/L — ABNORMAL HIGH (ref 38–126)
Anion gap: 10 (ref 5–15)
BUN: 19 mg/dL (ref 6–20)
CO2: 24 mmol/L (ref 22–32)
Calcium: 7.9 mg/dL — ABNORMAL LOW (ref 8.9–10.3)
Chloride: 92 mmol/L — ABNORMAL LOW (ref 98–111)
Creatinine, Ser: 0.72 mg/dL (ref 0.61–1.24)
GFR calc Af Amer: >60 mL/min (ref >60)
GFR calc non Af Amer: >60 mL/min (ref >60)
Glucose, Bld: 109 mg/dL — ABNORMAL HIGH (ref 70–99)
Potassium: 3 mmol/L — ABNORMAL LOW (ref 3.5–5.1)
Sodium: 126 mmol/L — ABNORMAL LOW (ref 135–145)
Total Bilirubin: 29.1 mg/dL (ref 0.3–1.2)
Total Protein: 6 g/dL — ABNORMAL LOW (ref 6.5–8.1)

## 2020-02-24 LAB — URINALYSIS, ROUTINE W REFLEX MICROSCOPIC
Glucose, UA: 50 mg/dL — AB
Hgb urine dipstick: NEGATIVE
Ketones, ur: NEGATIVE mg/dL
Leukocytes,Ua: NEGATIVE
Nitrite: NEGATIVE
Protein, ur: 30 mg/dL — AB
Specific Gravity, Urine: 1.025 (ref 1.005–1.030)
pH: 6 (ref 5.0–8.0)

## 2020-02-24 LAB — CBC
HCT: 32.8 % — ABNORMAL LOW (ref 39.0–52.0)
Hemoglobin: 11.7 g/dL — ABNORMAL LOW (ref 13.0–17.0)
MCH: 35.2 pg — ABNORMAL HIGH (ref 26.0–34.0)
MCHC: 35.7 g/dL (ref 30.0–36.0)
MCV: 98.8 fL (ref 80.0–100.0)
Platelets: 388 10*3/uL (ref 150–400)
RBC: 3.32 MIL/uL — ABNORMAL LOW (ref 4.22–5.81)
RDW: 16.1 % — ABNORMAL HIGH (ref 11.5–15.5)
WBC: 21.3 10*3/uL — ABNORMAL HIGH (ref 4.0–10.5)
nRBC: 0 % (ref 0.0–0.2)

## 2020-02-24 LAB — ACETAMINOPHEN LEVEL: Acetaminophen (Tylenol), Serum: <10 ug/mL — ABNORMAL LOW (ref 10–30)

## 2020-02-24 LAB — PROTIME-INR
INR: 1.5 — ABNORMAL HIGH (ref 0.8–1.2)
Prothrombin Time: 17.5 seconds — ABNORMAL HIGH (ref 11.4–15.2)

## 2020-02-24 LAB — APTT: aPTT: 33 seconds (ref 24–36)

## 2020-02-24 LAB — BILIRUBIN, DIRECT: Bilirubin, Direct: 18.2 mg/dL — ABNORMAL HIGH (ref 0.0–0.2)

## 2020-02-24 LAB — MAGNESIUM: Magnesium: 2.3 mg/dL (ref 1.7–2.4)

## 2020-02-24 LAB — LIPASE, BLOOD: Lipase: 71 U/L — ABNORMAL HIGH (ref 11–51)

## 2020-02-24 LAB — ETHANOL: Alcohol, Ethyl (B): <10 mg/dL (ref <10)

## 2020-02-24 LAB — AMMONIA: Ammonia: 26 umol/L (ref 9–35)

## 2020-02-24 MED ORDER — DEXTROSE 5 % IV SOLN
12.5000 mg/kg/h | INTRAVENOUS | Status: DC
  Administered 2020-02-25: 12.5 mg/kg/h via INTRAVENOUS
  Filled 2020-02-24: qty 120

## 2020-02-24 MED ORDER — ACETYLCYSTEINE LOAD VIA INFUSION
150.0000 mg/kg | Freq: Once | INTRAVENOUS | Status: AC
  Administered 2020-02-24: 17010 mg via INTRAVENOUS
  Filled 2020-02-24: qty 426

## 2020-02-24 MED ORDER — ACETYLCYSTEINE LOAD VIA INFUSION: 150.0000 mg/kg | Freq: Once | INTRAVENOUS | Status: DC

## 2020-02-24 MED ORDER — DEXTROSE 5 % IV SOLN
6.2500 mg/kg/h | INTRAVENOUS | Status: DC
  Administered 2020-02-25: 6.25 mg/kg/h via INTRAVENOUS
  Filled 2020-02-24 (×2): qty 120

## 2020-02-24 NOTE — ED Triage Notes (Signed)
Patient reports alcohol detox at home over the last month. Reports jaundice x2 days. Denies other pain or complaints. Sent from PCP.

## 2020-02-24 NOTE — H&P (Addendum)
History and Physical    Raymond Gomez AUQ:[id number] DOB: [birth year] DOA: 02/24/2020  PCP: Raymond Penna, MD  Patient coming from: home  I have personally briefly reviewed patient's old medical records in [facility]  Chief Complaint: painless jaundice x 10 days   HPI: Raymond Gomez is a 39 y.o. male with medical history significant of  hypertension, hypothyroidism and alcohol abuse/dependence presents in referral for pcp office for painless jaundice.  Per patient states that over the last 4-6 weeks he has began etoh detox at home, he did well and was able to wean himself of etoh completely a little over 2 weeks ago . He states s/p this he noted mild yellow of his eyes and self treated himself with liver aide over the counter from [facility] he states two days of taking medication he noted the yellow of his skin was significantly worse. He then presented to his pcp for evaluation. He states other that the color change in his skin and eye he has had no other symptoms. No abdomen pain, no n/v/ decrease appetite, fever/chills, diarrhea,no dark stools or blood in stools, swelling in his lower extremities or increase girth in his abdomen. He however did note darkening of his urine. He notes that he does not use iv drugs but does use marijuana occasionally.  He denies any family history of liver disease or any history of liver disease in the past. He does note a family history of gallbladder disease however.   ED Course:  Vitals: BP 113/64   Pulse 79   Temp 98.1 F (36.7 C) (Oral)   Resp (!) 27   Wt 113.4 kg   SpO2 97%   BMI 32.10 kg/m   Labs:  Cr 0.73 tbili 29.1 , wbc of 21, hgb of 11.7 down from 14.8 5 years ago  inr 1.5, alkphos 165, lipase 71 ast 167, alt103 Tylenol leve l< 10  U/s 1. Cirrhotic morphology of the liver with small amount of upper abdominal ascites 2. Echogenic non mobile mass in the gallbladder fundus, could reflect adherent stone, tumefactive sludge, or  less likely a polyp. Thickened gallbladder wall but without other sonographic features to suggest acute cholecystitis. Thickened gallbladder wall can be seen in the setting of cholecystitis, hepatocellular disease, or generalized edema forming states.   Review of Systems: As per HPI otherwise 10 point review of systems negative.   Past Medical History:  Diagnosis Date  . Hypercholesteremia   . Hypertension   . Thyroid disease     Past Surgical History:  Procedure Laterality Date  . WISDOM TOOTH EXTRACTION       reports that he has been smoking cigarettes. He has a 12.50 pack-year smoking history. He has never used smokeless tobacco. He reports current alcohol use. He reports that he does not use drugs.  No Known Allergies  No family history on file.  None   Prior to Admission medications   Medication Sig Start Date End Date Taking? Authorizing Provider  Multiple Vitamin (MULTIVITAMIN WITH MINERALS) TABS tablet Take 1 tablet by mouth daily.   Yes [provider]  vitamin C (ASCORBIC ACID) 500 MG tablet Take 1,000 mg by mouth daily.   Yes [provider]  atorvastatin (LIPITOR) 40 MG tablet Take 40 mg by mouth daily. 10/21/15   [provider]  atorvastatin (LIPITOR) 40 MG tablet Take 1 tablet (40 mg total) by mouth daily at 6 PM. Patient not taking: Reported on 02/24/2020 07/20/16   Jarvis Newcomer,  Fulton Reek, MD  levothyroxine (SYNTHROID, LEVOTHROID) 100 MCG tablet Take 1 tablet (100 mcg total) by mouth daily before breakfast. Patient not taking: Reported on 02/24/2020 07/21/16   Tyrone Nine, MD  metoprolol succinate (TOPROL-XL) 50 MG 24 hr tablet Take 50 mg by mouth daily. 12/15/15   [provider]  metoprolol succinate (TOPROL-XL) 50 MG 24 hr tablet Take 1 tablet (50 mg total) by mouth daily. Take with or immediately following a meal. Patient not taking: Reported on 02/24/2020 07/21/16   Tyrone Nine, MD  traZODone (DESYREL) 100 MG tablet Take 1 tablet  (100 mg total) by mouth at bedtime as needed for sleep. Patient not taking: Reported on 02/24/2020 07/20/16   Tyrone Nine, MD    Physical Exam: Vitals:   02/24/20 1644 02/24/20 2144 02/24/20 2215 02/24/20 2252  BP: (!) 149/81 (!) 144/91  135/78  Pulse: 96 88  100  Resp: 18 20  20   Temp: 98.1 F (36.7 C)     TempSrc: Oral     SpO2: 96% 99%  100%  Weight:   113.4 kg     Constitutional: NAD, calm, comfortable Vitals:   02/24/20 1644 02/24/20 2144 02/24/20 2215 02/24/20 2252  BP: (!) 149/81 (!) 144/91  135/78  Pulse: 96 88  100  Resp: 18 20  20   Temp: 98.1 F (36.7 C)     TempSrc: Oral     SpO2: 96% 99%  100%  Weight:   113.4 kg    Eyes: PERRL, icteric sclera ENMT: Mucous membranes are moist. Posterior pharynx clear of any exudate or lesions.Normal dentition.  Neck: normal, supple, no masses, no thyromegaly Respiratory: clear to auscultation bilaterally, no wheezing, no crackles. Normal respiratory effort. No accessory muscle use.  Cardiovascular: Regular rate and rhythm, no murmurs / rubs / gallops. No extremity edema. 2+ pedal pulses. No carotid bruits.  Abdomen: no tenderness, no masses palpated. No hepatosplenomegaly appreciated. Bowel sounds positive.  Musculoskeletal: no clubbing / cyanosis. No joint deformity upper and lower extremities. Good ROM, no contractures. Normal muscle tone.  Skin: no rashes, lesions, ulcers. No induration Neurologic: CN 2-12 grossly intact. Sensation intact,Strength 5/5 in all 4.  Psychiatric: Normal judgment and insight. Alert and oriented x 3. Normal mood.    Labs on Admission: I have personally reviewed following labs and imaging studies  CBC: Recent Labs  Lab 02/24/20 1907  WBC 21.3*  HGB 11.7*  HCT 32.8*  MCV 98.8  PLT 388   Basic Metabolic Panel: Recent Labs  Lab 02/24/20 1907 02/24/20 2010  NA 126*  --   K 3.0*  --   CL 92*  --   CO2 24  --   GLUCOSE 109*  --   BUN 19  --   CREATININE 0.72  --   CALCIUM 7.9*  --     MG  --  2.3   GFR: CrCl cannot be calculated (Unknown ideal weight.). Liver Function Tests: Recent Labs  Lab 02/24/20 1907  AST 167*  ALT 103*  ALKPHOS 165*  BILITOT 29.1*  PROT 6.0*  ALBUMIN 2.1*   Recent Labs  Lab 02/24/20 1907  LIPASE 71*   Recent Labs  Lab 02/24/20 2021  AMMONIA 26   Coagulation Profile: Recent Labs  Lab 02/24/20 1932  INR 1.5*   Cardiac Enzymes: No results for input(s): CKTOTAL, CKMB, CKMBINDEX, TROPONINI in the last 168 hours. BNP (last 3 results) No results for input(s): PROBNP in the last 8760 hours. HbA1C: No results  for input(s): HGBA1C in the last 72 hours. CBG: No results for input(s): GLUCAP in the last 168 hours. Lipid Profile: No results for input(s): CHOL, HDL, LDLCALC, TRIG, CHOLHDL, LDLDIRECT in the last 72 hours. Thyroid Function Tests: No results for input(s): TSH, T4TOTAL, FREET4, T3FREE, THYROIDAB in the last 72 hours. Anemia Panel: No results for input(s): VITAMINB12, FOLATE, FERRITIN, TIBC, IRON, RETICCTPCT in the last 72 hours. Urine analysis:    Component Value Date/Time   COLORURINE AMBER (A) 02/24/2020 1907   APPEARANCEUR HAZY (A) 02/24/2020 1907   LABSPEC 1.025 02/24/2020 1907   PHURINE 6.0 02/24/2020 1907   GLUCOSEU 50 (A) 02/24/2020 1907   HGBUR NEGATIVE 02/24/2020 [place] (A) 02/24/2020 1907   KETONESUR NEGATIVE 02/24/2020 1907   PROTEINUR 30 (A) 02/24/2020 1907   NITRITE NEGATIVE 02/24/2020 1907   LEUKOCYTESUR NEGATIVE 02/24/2020 1907    Radiological Exams on Admission: US Abdomen Limited RUQ  Result Date: 02/24/2020 CLINICAL DATA:  Elevated bilirubin jaundice EXAM: ULTRASOUND ABDOMEN LIMITED RIGHT UPPER QUADRANT COMPARISON:  None. FINDINGS: Gallbladder: Slightly thickened gallbladder wall measuring 5.4 mm. Negative sonographic Murphy. Hyperechoic lesion in the fundus measuring 1.2 cm. Common bile duct: Diameter: 4.1 mm Liver: Liver is echogenic. Nodular hepatic contour suspicious  for cirrhosis. Portal vein is patent on color Doppler imaging with normal direction of blood flow towards the liver. Other: Small amount of upper abdominal ascites IMPRESSION: 1. Cirrhotic morphology of the liver with small amount of upper abdominal ascites 2. Echogenic non mobile mass in the gallbladder fundus, could reflect adherent stone, tumefactive sludge, or less likely a polyp. Thickened gallbladder wall but without other sonographic features to suggest acute cholecystitis. Thickened gallbladder wall can be seen in the setting of cholecystitis, hepatocellular disease, or generalized edema forming states. Electronically Signed   By: Donavan Foil M.D.   On: 02/24/2020 22:30    EKG: Independently reviewed. Sinus rhythm LAE no change from prior  Assessment/Plan Painless Jaundice  /Acute Liver injury NOS -no obvious toxic exposure other than etoh  -tylenol level < 10  -NAC started per gi recs  -monitor lft -hep panel to be complete  -inflammatory markers -note gallstone disease but no obstruction of duct noted on u/s -Ct scan pending  -MRCP in am     Leukocytosis -no fever , ua negative  -possible relate to liver process  -no abx currently will continue to monitor   Anemia -drop in h/h but last in system 5 years ago -possible relate to etoh/cirrhosis  -check fob/ check anemia panel  ETOH abuse  -no need for ciwa sober x 10 days  - social to see   HTN /hypothroidism  -stable s/p weight loss  He has not required treatment for these diagnosis   Obesity  -consider nutrition consult as outpatient on d/c  FEN Hypokalemia  Hyponatremia  Relate to etoh use volume status  Limited ivfs  Replete lytes prn   DVT prophylaxis:scd  Code Status:FULL Family Communication:"n/a Disposition Plan:  3-5 days  Consults called: by ed GI/poison control  Admission status: inpatient  Clance Boll MD [facility]  If 7PM-7AM, please contact  night-coverage [email or web address] Password [facility]  02/24/2020, 10:54 PM

## 2020-02-24 NOTE — ED Provider Notes (Signed)
[facility] DEPT Provider Note   CSN: [id number] Arrival date & time: 02/24/20  1615     History Chief Complaint  Patient presents with  . Abnormal Lab    Raymond Gomez is a 39 y.o. male with a past medical history of alcohol abuse, hypertension, high cholesterol, thyroid disease, who presents today for evaluation of jaundice.  He reports that he went to his primary care doctor's office [facility] and was sent to the emergency room.  He states that about 10 days ago he finished successfully weaning himself off alcohol.  He states that he is unsure how much he was drinking at baseline however notes he has been drinking heavily over at least the past year.  He states that he has been reducing alcohol in a step wise fashion by starting at 10 drinks and stepping down a drink every few days.  He stopped drinking about 10 days ago.  He states that about 2 days before that he started using liver aid which he got over-the-counter from [facility].  He states that it was about 2 days after that he started noticing he has been getting jaundiced, primarily in his eyes.  He does report that last week he had some pain between his shoulder blades after he moved heavy boxes, he took 2 Aleve for this and this resolved.  He denies any drug use other than occasional marijuana and alcohol.  No history of injection drug use.  He denies any fevers.  No abdominal pain or distention.  He denies dysuria or other urinary symptoms.  Last bowel movement was this morning and was normal for him.  He denies tylenol use.  No significant symptoms.    He   HPI     Past Medical History:  Diagnosis Date  . Hypercholesteremia   . Hypertension   . Thyroid disease     Patient Active Problem List   Diagnosis Date Noted  . Jaundice 02/25/2020  . Acute respiratory failure with hypoxemia ([place])   . Alcohol withdrawal ([place]) 07/15/2016    Past Surgical History:  Procedure  Laterality Date  . WISDOM TOOTH EXTRACTION         No family history on file.  Social History   Tobacco Use  . Smoking status: Current Every Day Smoker    Packs/day: 0.50    Years: 25.00    Pack years: 12.50    Types: Cigarettes  . Smokeless tobacco: Never Used  Substance Use Topics  . Alcohol use: Yes    Comment: 10-15 drinks per week  . Drug use: No    Home Medications Prior to Admission medications   Medication Sig Start Date End Date Taking? Authorizing Provider  Multiple Vitamin (MULTIVITAMIN WITH MINERALS) TABS tablet Take 1 tablet by mouth daily.   Yes [provider]  vitamin C (ASCORBIC ACID) 500 MG tablet Take 1,000 mg by mouth daily.   Yes [provider]  atorvastatin (LIPITOR) 40 MG tablet Take 40 mg by mouth daily. 10/21/15   [provider]  atorvastatin (LIPITOR) 40 MG tablet Take 1 tablet (40 mg total) by mouth daily at 6 PM. Patient not taking: Reported on 02/24/2020 07/20/16   Patrecia Pour, MD  levothyroxine (SYNTHROID, LEVOTHROID) 100 MCG tablet Take 1 tablet (100 mcg total) by mouth daily before breakfast. Patient not taking: Reported on 02/24/2020 07/21/16   Patrecia Pour, MD  metoprolol succinate (TOPROL-XL) 50 MG 24 hr tablet Take 50 mg  by mouth daily. 12/15/15   [provider]  metoprolol succinate (TOPROL-XL) 50 MG 24 hr tablet Take 1 tablet (50 mg total) by mouth daily. Take with or immediately following a meal. Patient not taking: Reported on 02/24/2020 07/21/16   Patrecia Pour, MD  traZODone (DESYREL) 100 MG tablet Take 1 tablet (100 mg total) by mouth at bedtime as needed for sleep. Patient not taking: Reported on 02/24/2020 07/20/16   Patrecia Pour, MD    Allergies    Patient has no known allergies.  Review of Systems   Review of Systems  Constitutional: Negative for chills, diaphoresis, fatigue and fever.  HENT: Negative for congestion.   Respiratory: Negative for cough, chest tightness and shortness of  breath.   Cardiovascular: Negative for chest pain, palpitations and leg swelling.  Gastrointestinal: Negative for abdominal pain, blood in stool, constipation, diarrhea, nausea and vomiting.  Genitourinary: Negative for dysuria and urgency.  Musculoskeletal: Negative for back pain and neck pain.  Skin: Positive for color change. Negative for rash and wound.  Neurological: Negative for weakness and headaches.  Psychiatric/Behavioral: Negative for confusion.  All other systems reviewed and are negative.   Physical Exam Updated Vital Signs BP 113/64   Pulse 79   Temp 98.1 F (36.7 C) (Oral)   Resp (!) 27   Wt 113.4 kg   SpO2 97%   BMI 32.10 kg/m   Physical Exam Vitals and nursing note reviewed.  Constitutional:      Appearance: He is well-developed. He is not ill-appearing.     Comments: Marked Jaundice  HENT:     Head: Normocephalic and atraumatic.     Nose: Nose normal.     Mouth/Throat:     Mouth: Mucous membranes are moist.  Eyes:     General: Scleral icterus present.     Conjunctiva/sclera: Conjunctivae normal.  Cardiovascular:     Rate and Rhythm: Normal rate and regular rhythm.     Heart sounds: Murmur present.  Pulmonary:     Effort: Pulmonary effort is normal. No respiratory distress.     Breath sounds: Normal breath sounds.  Abdominal:     General: Abdomen is flat. There is no distension.     Palpations: Abdomen is soft.     Tenderness: There is no abdominal tenderness. There is no guarding.  Genitourinary:    Comments: Deferred  Musculoskeletal:        General: No signs of injury.     Cervical back: Normal range of motion and neck supple.  Skin:    General: Skin is warm and dry.     Coloration: Skin is jaundiced.     Comments: Patient is obviously jaundiced.    Neurological:     General: No focal deficit present.     Mental Status: He is alert and oriented to person, place, and time.  Psychiatric:        Mood and Affect: Mood normal.         Behavior: Behavior normal.     ED Results / Procedures / Treatments   Labs (all labs ordered are listed, but only abnormal results are displayed) Labs Reviewed  LIPASE, BLOOD - Abnormal; Notable for the following components:      Result Value   Lipase 71 (*)    All other components within normal limits  COMPREHENSIVE METABOLIC PANEL - Abnormal; Notable for the following components:   Sodium 126 (*)    Potassium 3.0 (*)    Chloride 92 (*)  Glucose, Bld 109 (*)    Calcium 7.9 (*)    Total Protein 6.0 (*)    Albumin 2.1 (*)    AST 167 (*)    ALT 103 (*)    Alkaline Phosphatase 165 (*)    Total Bilirubin 29.1 (*)    All other components within normal limits  CBC - Abnormal; Notable for the following components:   WBC 21.3 (*)    RBC 3.32 (*)    Hemoglobin 11.7 (*)    HCT 32.8 (*)    MCH 35.2 (*)    RDW 16.1 (*)    All other components within normal limits  URINALYSIS, ROUTINE W REFLEX MICROSCOPIC - Abnormal; Notable for the following components:   Color, Urine AMBER (*)    APPearance HAZY (*)    Glucose, UA 50 (*)    Bilirubin Urine MODERATE (*)    Protein, ur 30 (*)    Bacteria, UA FEW (*)    All other components within normal limits  PROTIME-INR - Abnormal; Notable for the following components:   Prothrombin Time 17.5 (*)    INR 1.5 (*)    All other components within normal limits  ACETAMINOPHEN LEVEL - Abnormal; Notable for the following components:   Acetaminophen (Tylenol), Serum <10 (*)    All other components within normal limits  BILIRUBIN, DIRECT - Abnormal; Notable for the following components:   Bilirubin, Direct 18.2 (*)    All other components within normal limits  SARS CORONAVIRUS 2 BY RT PCR (HOSPITAL ORDER, [facility])  CULTURE, BLOOD (ROUTINE X 2)  CULTURE, BLOOD (ROUTINE X 2)  MAGNESIUM  ETHANOL  AMMONIA  APTT  HEPATITIS PANEL, ACUTE  HIV ANTIBODY (ROUTINE TESTING W REFLEX)  TSH  HEMOGLOBIN A1C  COMPREHENSIVE  METABOLIC PANEL  CBC  PROTIME-INR    EKG EKG Interpretation  Date/Time:  Wednesday Feb 24 2020 21:08:06 EDT Ventricular Rate:  85 PR Interval:    QRS Duration: 103 QT Interval:  398 QTC Calculation: 474 R Axis:   63 Text Interpretation: Sinus rhythm Left atrial enlargement When compared to prior,  no significnat changes seen. No STEMI Confirmed by Antony Blackbird [phone]) on 02/24/2020 11:32:34 PM   Radiology US Abdomen Limited RUQ  Result Date: 02/24/2020 CLINICAL DATA:  Elevated bilirubin jaundice EXAM: ULTRASOUND ABDOMEN LIMITED RIGHT UPPER QUADRANT COMPARISON:  None. FINDINGS: Gallbladder: Slightly thickened gallbladder wall measuring 5.4 mm. Negative sonographic Murphy. Hyperechoic lesion in the fundus measuring 1.2 cm. Common bile duct: Diameter: 4.1 mm Liver: Liver is echogenic. Nodular hepatic contour suspicious for cirrhosis. Portal vein is patent on color Doppler imaging with normal direction of blood flow towards the liver. Other: Small amount of upper abdominal ascites IMPRESSION: 1. Cirrhotic morphology of the liver with small amount of upper abdominal ascites 2. Echogenic non mobile mass in the gallbladder fundus, could reflect adherent stone, tumefactive sludge, or less likely a polyp. Thickened gallbladder wall but without other sonographic features to suggest acute cholecystitis. Thickened gallbladder wall can be seen in the setting of cholecystitis, hepatocellular disease, or generalized edema forming states. Electronically Signed   By: Donavan Foil M.D.   On: 02/24/2020 22:30    Procedures .Critical Care Performed by: Lorin Glass, PA-C Authorized by: Lorin Glass, PA-C   Critical care provider statement:    Critical care time (minutes):  45   Critical care was time spent personally by me on the following activities:  Discussions with consultants, evaluation of patient's response  to treatment, examination of patient, ordering and performing treatments  and interventions, ordering and review of laboratory studies, ordering and review of radiographic studies, pulse oximetry, re-evaluation of patient's condition, obtaining history from patient or surrogate and review of old charts Comments:     Consult with poison control, GI, administration of Mucomyst, liver failure   (including critical care time)  Medications Ordered in ED Medications  acetylcysteine (ACETADOTE) 40 mg/mL load via infusion 17,010 mg (17,010 mg Intravenous Bolus from Bag 02/24/20 2310)    Followed by  acetylcysteine (ACETADOTE) 24,000 mg in dextrose 5 % 600 mL (40 mg/mL) infusion (12.5 mg/kg/hr  113.4 kg Intravenous New Bag/Given 02/25/20 0022)    Followed by  acetylcysteine (ACETADOTE) 24,000 mg in dextrose 5 % 600 mL (40 mg/mL) infusion (has no administration in time range)  sodium chloride flush (NS) 0.9 % injection 3 mL (has no administration in time range)    ED Course  I have reviewed the triage vital signs and the nursing notes.  Pertinent labs & imaging results that were available during my care of the patient were reviewed by me and considered in my medical decision making (see chart for details).  Clinical Course as of Feb 25 103  Wed Feb 24, 2020  2017 Started liveraid   [EH]  2137 I spoke with Dr. Paulita Fujita of [facility].  Recommends admission, if PT/INR is elevated start empirically on Mucomyst.     [EH]    Clinical Course User Index [EH] Ollen Gross   MDM Rules/Calculators/A&P                     Patient is a 39 year old man with a past medical history of alcohol abuse who presents today for evaluation of jaundice.  He states that during [employer] he started drinking more, however have been able to successfully, without going into withdrawals, wean himself off alcohol with his last drink about 10 days ago.  12 days ago he started at liver aid, and over-the-counter supplement.  12 days ago he also noticed slight jaundice.  He states  that that has continued to worsen.  He denies any other symptoms, no fatigue, abdominal pain swelling or bloating.  He did have back pain after lifting some heavy boxes however that fully resolved after he took 2 Aleve.  No fevers.  He denies any other drug use.    Labs are obtained and reviewed, CMP with multiple significant derangements including a total bilirubin of 29.1, transaminitis with AST and ALT 167 and 103 respectively.  Alk phos is elevated at 165.  Albumin is low at 2.1 with total protein of 6.  He has electrolyte derangements including sodium of 126, potassium of 3, chloride of 92.  INR elevated at 1.5.  Alcohol and acetaminophen are undetected.  Ammonia is not elevated, PTT is normal, bilirubin direct is 18.2.    Given his obvious jaundice poison control is contacted regarding the timing and use of liver aid reportedly correlates with the onset of his jaundice.  No specific interventions recommended, poison control reports that toxicologist feels this is unlikely to be related.  I spoke with Dr. Paulita Fujita of [facility] who recommends starting patient on Mucomyst, obtaining right upper quadrant ultrasound, admitting patient and he will be seen by GI in the morning.  Acute hepatitis panel is sent.    Right upper quadrant ultrasound shows no evidence of obstruction or duct dilation, or cholecystitis. Hospitalist is consulted, I spoke  with Dr. Marcello Moores who will see the patient for admission.  Note: Portions of this report may have been transcribed using voice recognition software. Every effort was made to ensure accuracy; however, inadvertent computerized transcription errors may be present   Final Clinical Impression(s) / ED Diagnoses Final diagnoses:  High bilirubin  Jaundice    Rx / DC Orders ED Discharge Orders    None       Lorin Glass, PA-C 02/25/20 0113    Tegeler, Gwenyth Allegra, MD 02/25/20 [phone]

## 2020-02-25 ENCOUNTER — Encounter (HOSPITAL_COMMUNITY): Payer: Self-pay | Admitting: Internal Medicine

## 2020-02-25 ENCOUNTER — Inpatient Hospital Stay (HOSPITAL_COMMUNITY): Payer: BC Managed Care – PPO

## 2020-02-25 ENCOUNTER — Other Ambulatory Visit: Payer: Self-pay

## 2020-02-25 DIAGNOSIS — R935 Abnormal findings on diagnostic imaging of other abdominal regions, including retroperitoneum: Secondary | ICD-10-CM | POA: Diagnosis not present

## 2020-02-25 DIAGNOSIS — F1010 Alcohol abuse, uncomplicated: Secondary | ICD-10-CM

## 2020-02-25 DIAGNOSIS — K7031 Alcoholic cirrhosis of liver with ascites: Secondary | ICD-10-CM

## 2020-02-25 DIAGNOSIS — K8070 Calculus of gallbladder and bile duct without cholecystitis without obstruction: Secondary | ICD-10-CM | POA: Diagnosis present

## 2020-02-25 DIAGNOSIS — K766 Portal hypertension: Secondary | ICD-10-CM | POA: Diagnosis present

## 2020-02-25 DIAGNOSIS — K8050 Calculus of bile duct without cholangitis or cholecystitis without obstruction: Secondary | ICD-10-CM | POA: Diagnosis not present

## 2020-02-25 DIAGNOSIS — E039 Hypothyroidism, unspecified: Secondary | ICD-10-CM | POA: Diagnosis present

## 2020-02-25 DIAGNOSIS — E871 Hypo-osmolality and hyponatremia: Secondary | ICD-10-CM | POA: Diagnosis present

## 2020-02-25 DIAGNOSIS — Z20822 Contact with and (suspected) exposure to covid-19: Secondary | ICD-10-CM | POA: Diagnosis present

## 2020-02-25 DIAGNOSIS — R17 Unspecified jaundice: Secondary | ICD-10-CM | POA: Diagnosis present

## 2020-02-25 DIAGNOSIS — F1290 Cannabis use, unspecified, uncomplicated: Secondary | ICD-10-CM | POA: Diagnosis present

## 2020-02-25 DIAGNOSIS — E538 Deficiency of other specified B group vitamins: Secondary | ICD-10-CM | POA: Diagnosis present

## 2020-02-25 DIAGNOSIS — K7460 Unspecified cirrhosis of liver: Secondary | ICD-10-CM | POA: Diagnosis not present

## 2020-02-25 DIAGNOSIS — K7011 Alcoholic hepatitis with ascites: Secondary | ICD-10-CM | POA: Diagnosis not present

## 2020-02-25 DIAGNOSIS — Z79890 Hormone replacement therapy (postmenopausal): Secondary | ICD-10-CM | POA: Diagnosis not present

## 2020-02-25 DIAGNOSIS — E876 Hypokalemia: Secondary | ICD-10-CM | POA: Diagnosis present

## 2020-02-25 DIAGNOSIS — E7800 Pure hypercholesterolemia, unspecified: Secondary | ICD-10-CM | POA: Diagnosis present

## 2020-02-25 DIAGNOSIS — F17210 Nicotine dependence, cigarettes, uncomplicated: Secondary | ICD-10-CM | POA: Diagnosis present

## 2020-02-25 DIAGNOSIS — I10 Essential (primary) hypertension: Secondary | ICD-10-CM | POA: Diagnosis present

## 2020-02-25 DIAGNOSIS — E669 Obesity, unspecified: Secondary | ICD-10-CM | POA: Diagnosis present

## 2020-02-25 DIAGNOSIS — D689 Coagulation defect, unspecified: Secondary | ICD-10-CM | POA: Diagnosis present

## 2020-02-25 DIAGNOSIS — Z79899 Other long term (current) drug therapy: Secondary | ICD-10-CM | POA: Diagnosis not present

## 2020-02-25 DIAGNOSIS — D649 Anemia, unspecified: Secondary | ICD-10-CM | POA: Diagnosis present

## 2020-02-25 DIAGNOSIS — K7290 Hepatic failure, unspecified without coma: Secondary | ICD-10-CM | POA: Diagnosis not present

## 2020-02-25 DIAGNOSIS — R946 Abnormal results of thyroid function studies: Secondary | ICD-10-CM | POA: Diagnosis present

## 2020-02-25 DIAGNOSIS — D72829 Elevated white blood cell count, unspecified: Secondary | ICD-10-CM | POA: Diagnosis present

## 2020-02-25 DIAGNOSIS — R188 Other ascites: Secondary | ICD-10-CM | POA: Diagnosis not present

## 2020-02-25 LAB — RETICULOCYTES
Immature Retic Fract: 6.1 % (ref 2.3–15.9)
RBC.: 2.88 MIL/uL — ABNORMAL LOW (ref 4.22–5.81)
Retic Count, Absolute: 124.1 10*3/uL (ref 19.0–186.0)
Retic Ct Pct: 4.3 % — ABNORMAL HIGH (ref 0.4–3.1)

## 2020-02-25 LAB — HEMOGLOBIN A1C
Hgb A1c MFr Bld: 4.5 % — ABNORMAL LOW (ref 4.8–5.6)
Mean Plasma Glucose: 82.45 mg/dL

## 2020-02-25 LAB — FERRITIN: Ferritin: 206 ng/mL (ref 24–336)

## 2020-02-25 LAB — COMPREHENSIVE METABOLIC PANEL
ALT: 93 U/L — ABNORMAL HIGH (ref 0–44)
AST: 138 U/L — ABNORMAL HIGH (ref 15–41)
Albumin: 1.9 g/dL — ABNORMAL LOW (ref 3.5–5.0)
Alkaline Phosphatase: 138 U/L — ABNORMAL HIGH (ref 38–126)
Anion gap: 10 (ref 5–15)
BUN: 17 mg/dL (ref 6–20)
CO2: 23 mmol/L (ref 22–32)
Calcium: 7.6 mg/dL — ABNORMAL LOW (ref 8.9–10.3)
Chloride: 92 mmol/L — ABNORMAL LOW (ref 98–111)
Creatinine, Ser: 0.64 mg/dL (ref 0.61–1.24)
GFR calc Af Amer: >60 mL/min (ref >60)
GFR calc non Af Amer: >60 mL/min (ref >60)
Glucose, Bld: 102 mg/dL — ABNORMAL HIGH (ref 70–99)
Potassium: 2.5 mmol/L — CL (ref 3.5–5.1)
Sodium: 125 mmol/L — ABNORMAL LOW (ref 135–145)
Total Bilirubin: 24.4 mg/dL (ref 0.3–1.2)
Total Protein: 5.3 g/dL — ABNORMAL LOW (ref 6.5–8.1)

## 2020-02-25 LAB — HEPATITIS PANEL, ACUTE
HCV Ab: NONREACTIVE
Hep A IgM: NONREACTIVE
Hep B C IgM: NONREACTIVE
Hepatitis B Surface Ag: NONREACTIVE

## 2020-02-25 LAB — PROTIME-INR
INR: 1.6 — ABNORMAL HIGH (ref 0.8–1.2)
Prothrombin Time: 18.5 seconds — ABNORMAL HIGH (ref 11.4–15.2)

## 2020-02-25 LAB — IRON AND TIBC
Iron: 120 ug/dL (ref 45–182)
Saturation Ratios: 79 % — ABNORMAL HIGH (ref 17.9–39.5)
TIBC: 153 ug/dL — ABNORMAL LOW (ref 250–450)
UIBC: 33 ug/dL

## 2020-02-25 LAB — CBC
HCT: 28 % — ABNORMAL LOW (ref 39.0–52.0)
Hemoglobin: 10.2 g/dL — ABNORMAL LOW (ref 13.0–17.0)
MCH: 35.5 pg — ABNORMAL HIGH (ref 26.0–34.0)
MCHC: 36.4 g/dL — ABNORMAL HIGH (ref 30.0–36.0)
MCV: 97.6 fL (ref 80.0–100.0)
Platelets: 327 10*3/uL (ref 150–400)
RBC: 2.87 MIL/uL — ABNORMAL LOW (ref 4.22–5.81)
RDW: 16 % — ABNORMAL HIGH (ref 11.5–15.5)
WBC: 18.6 10*3/uL — ABNORMAL HIGH (ref 4.0–10.5)
nRBC: 0 % (ref 0.0–0.2)

## 2020-02-25 LAB — HIV ANTIBODY (ROUTINE TESTING W REFLEX): HIV Screen 4th Generation wRfx: NONREACTIVE

## 2020-02-25 LAB — TSH: TSH: 4.975 u[IU]/mL — ABNORMAL HIGH (ref 0.350–4.500)

## 2020-02-25 LAB — FOLATE: Folate: 4.7 ng/mL — ABNORMAL LOW (ref >5.9)

## 2020-02-25 LAB — C-REACTIVE PROTEIN: CRP: 3.3 mg/dL — ABNORMAL HIGH (ref <1.0)

## 2020-02-25 LAB — SEDIMENTATION RATE: Sed Rate: 18 mm/hr — ABNORMAL HIGH (ref 0–16)

## 2020-02-25 LAB — VITAMIN B12: Vitamin B-12: 1573 pg/mL — ABNORMAL HIGH (ref 180–914)

## 2020-02-25 MED ORDER — SODIUM CHLORIDE (PF) 0.9 % IJ SOLN
INTRAMUSCULAR | Status: AC
  Filled 2020-02-25: qty 50

## 2020-02-25 MED ORDER — POTASSIUM CHLORIDE CRYS ER 20 MEQ PO TBCR
40.0000 meq | EXTENDED_RELEASE_TABLET | ORAL | Status: AC
  Administered 2020-02-25: 40 meq via ORAL
  Filled 2020-02-25: qty 2

## 2020-02-25 MED ORDER — IOHEXOL 300 MG/ML  SOLN
100.0000 mL | Freq: Once | INTRAMUSCULAR | Status: AC | PRN
  Administered 2020-02-25: 100 mL via INTRAVENOUS

## 2020-02-25 MED ORDER — ALBUTEROL SULFATE (2.5 MG/3ML) 0.083% IN NEBU: 2.5000 mg | INHALATION_SOLUTION | RESPIRATORY_TRACT | Status: DC | PRN

## 2020-02-25 MED ORDER — SODIUM CHLORIDE 0.9 % IV SOLN: INTRAVENOUS | Status: DC | PRN

## 2020-02-25 MED ORDER — PREDNISOLONE 5 MG PO TABS
40.0000 mg | ORAL_TABLET | Freq: Every day | ORAL | Status: DC
  Administered 2020-02-26 – 2020-02-27 (×2): 40 mg via ORAL
  Filled 2020-02-25 (×3): qty 8

## 2020-02-25 MED ORDER — POTASSIUM CHLORIDE CRYS ER 20 MEQ PO TBCR
40.0000 meq | EXTENDED_RELEASE_TABLET | ORAL | Status: AC
  Administered 2020-02-25 – 2020-02-26 (×2): 40 meq via ORAL
  Filled 2020-02-25 (×2): qty 2

## 2020-02-25 MED ORDER — ONDANSETRON HCL 4 MG/2ML IJ SOLN: 4.0000 mg | Freq: Four times a day (QID) | INTRAMUSCULAR | Status: DC | PRN

## 2020-02-25 MED ORDER — SODIUM CHLORIDE 0.9% FLUSH
3.0000 mL | Freq: Two times a day (BID) | INTRAVENOUS | Status: DC
  Administered 2020-02-25 – 2020-02-27 (×3): 3 mL via INTRAVENOUS

## 2020-02-25 MED ORDER — IOHEXOL 9 MG/ML PO SOLN
500.0000 mL | ORAL | Status: AC
  Administered 2020-02-25: 500 mL via ORAL

## 2020-02-25 MED ORDER — GADOBUTROL 1 MMOL/ML IV SOLN
10.0000 mL | Freq: Once | INTRAVENOUS | Status: AC | PRN
  Administered 2020-02-25: 10 mL via INTRAVENOUS

## 2020-02-25 MED ORDER — IOHEXOL 9 MG/ML PO SOLN
ORAL | Status: AC
  Filled 2020-02-25: qty 1000

## 2020-02-25 MED ORDER — FOLIC ACID 1 MG PO TABS
1.0000 mg | ORAL_TABLET | Freq: Every day | ORAL | Status: DC
  Administered 2020-02-26 – 2020-02-27 (×2): 1 mg via ORAL
  Filled 2020-02-25 (×2): qty 1

## 2020-02-25 MED ORDER — ONDANSETRON HCL 4 MG PO TABS: 4.0000 mg | ORAL_TABLET | Freq: Four times a day (QID) | ORAL | Status: DC | PRN

## 2020-02-25 NOTE — Consult Note (Signed)
Referring Provider: Elizabeth Hammond, PA-C Primary Care Physician:  Holwerda, Scott, MD Primary Gastroenterologist:  Unassigned  Reason for Consultation:  Jaundice  HPI: Raymond Gomez is a 38 y.o. male with history of HTN and hypothyroidism presenting with a chief complaint of painless jaundice.  Patient reports he started noticing slight yellowing of his skin and eyes approximately 10 days which has progressively worsened which caused him to seek care at the ED.  He started taking an over-the-counter supplement called "liver aid" which appears to be able and of amino acids.  He called poison control and they did not believe this was contributing to his jaundice.  This is the first time he is ever noticed jaundice.  Denies any known history of liver disease.  He denies any abdominal pain, nausea, vomiting, hematemesis, heartburn, dysphagia, changes in bowel movements, melena, hematochezia, decreased appetite, early satiety, or unexplained weight loss.  He denies chest pain, shortness of breath, abdominal distention, lower extremity edema.  Patient reports chronic heavy alcohol use.  Up until 10 days ago, he was drinking at least 10 alcoholic drinks per day (either whiskey or vodka).  He started slowly decreasing his alcohol use by 1 drink per day with his last drink occurring 10 days ago.  He denies any IV drug use.  Denies any NSAID use.  Denies any family history of liver disease or gastrointestinal malignancies.  He has never had an EGD or colonoscopy.  Past Medical History:  Diagnosis Date  . Hypercholesteremia   . Hypertension   . Thyroid disease     Past Surgical History:  Procedure Laterality Date  . WISDOM TOOTH EXTRACTION      Prior to Admission medications   Medication Sig Start Date End Date Taking? Authorizing Provider  Multiple Vitamin (MULTIVITAMIN WITH MINERALS) TABS tablet Take 1 tablet by mouth daily.   Yes [provider]  vitamin C (ASCORBIC ACID) 500 MG  tablet Take 1,000 mg by mouth daily.   Yes [provider]  atorvastatin (LIPITOR) 40 MG tablet Take 40 mg by mouth daily. 10/21/15   [provider]  atorvastatin (LIPITOR) 40 MG tablet Take 1 tablet (40 mg total) by mouth daily at 6 PM. Patient not taking: Reported on 02/24/2020 07/20/16   Grunz, Ryan B, MD  levothyroxine (SYNTHROID, LEVOTHROID) 100 MCG tablet Take 1 tablet (100 mcg total) by mouth daily before breakfast. Patient not taking: Reported on 02/24/2020 07/21/16   Grunz, Ryan B, MD  metoprolol succinate (TOPROL-XL) 50 MG 24 hr tablet Take 50 mg by mouth daily. 12/15/15   [provider]  metoprolol succinate (TOPROL-XL) 50 MG 24 hr tablet Take 1 tablet (50 mg total) by mouth daily. Take with or immediately following a meal. Patient not taking: Reported on 02/24/2020 07/21/16   Grunz, Ryan B, MD  traZODone (DESYREL) 100 MG tablet Take 1 tablet (100 mg total) by mouth at bedtime as needed for sleep. Patient not taking: Reported on 02/24/2020 07/20/16   Grunz, Ryan B, MD    Scheduled Meds: . folic acid  1 mg Oral Daily  . potassium chloride  40 mEq Oral Q4H  . sodium chloride (PF)      . sodium chloride flush  3 mL Intravenous Q12H   Continuous Infusions: . acetylcysteine 6.25 mg/kg/hr (02/25/20 0524)   PRN Meds:.albuterol, gadobutrol, ondansetron **OR** ondansetron (ZOFRAN) IV  Allergies as of 02/24/2020  . (No Known Allergies)    History reviewed. No pertinent family history.  Social History     Socioeconomic History  . Marital status: Single    Spouse name: Not on file  . Number of children: Not on file  . Years of education: Not on file  . Highest education level: Not on file  Occupational History  . Not on file  Tobacco Use  . Smoking status: Current Every Day Smoker    Packs/day: 0.50    Years: 25.00    Pack years: 12.50    Types: Cigarettes  . Smokeless tobacco: Never Used  Substance and Sexual Activity  . Alcohol use: Yes     Comment: 10-15 drinks per week  . Drug use: No  . Sexual activity: Not Currently  Other Topics Concern  . Not on file  Social History Narrative  . Not on file   Social Determinants of Health   Financial Resource Strain:   . Difficulty of Paying Living Expenses:   Food Insecurity:   . Worried About Charity fundraiser in the Last Year:   . Arboriculturist in the Last Year:   Transportation Needs:   . Film/video editor (Medical):   Marland Kitchen Lack of Transportation (Non-Medical):   Physical Activity:   . Days of Exercise per Week:   . Minutes of Exercise per Session:   Stress:   . Feeling of Stress :   Social Connections:   . Frequency of Communication with Friends and Family:   . Frequency of Social Gatherings with Friends and Family:   . Attends Religious Services:   . Active Member of Clubs or Organizations:   . Attends Archivist Meetings:   Marland Kitchen Marital Status:   Intimate Partner Violence:   . Fear of Current or Ex-Partner:   . Emotionally Abused:   Marland Kitchen Physically Abused:   . Sexually Abused:     Review of Systems: Review of Systems  Constitutional: Negative for chills, fever and weight loss.  HENT: Negative for hearing loss and tinnitus.   Eyes: Negative for blurred vision and pain.       +scleral icterus  Respiratory: Negative for cough and shortness of breath.   Cardiovascular: Negative for chest pain and palpitations.  Gastrointestinal: Negative for abdominal pain, blood in stool, constipation, diarrhea, heartburn, melena, nausea and vomiting.  Genitourinary: Negative for dysuria and hematuria.       +dark urine  Musculoskeletal: Negative for joint pain and myalgias.  Skin: Negative for itching and rash.       +jaundice  Neurological: Negative for seizures and loss of consciousness.  Endo/Heme/Allergies: Negative for polydipsia. Does not bruise/bleed easily.  Psychiatric/Behavioral: Positive for substance abuse (alcohol). The patient is not nervous/anxious.       Physical Exam: Vital signs: Vitals:   02/25/20 0614 02/25/20 0938  BP: 133/74 131/74  Pulse: 84 82  Resp: 20 18  Temp: 97.9 F (36.6 C) (!) 97.5 F (36.4 C)  SpO2: 98% 99%   Last BM Date: 02/24/20  Physical Exam  Constitutional: He is oriented to person, place, and time. He appears well-developed and well-nourished. No distress.  HENT:  Head: Normocephalic and atraumatic.  Eyes: EOM are normal. Scleral icterus is present.  Cardiovascular: Normal rate, regular rhythm and normal heart sounds.  Pulmonary/Chest: Effort normal and breath sounds normal. No respiratory distress.  Abdominal: Soft. Bowel sounds are normal. He exhibits distension. He exhibits no mass. There is no abdominal tenderness. There is no rebound and no guarding.  Musculoskeletal:        General: No deformity or  edema.     Cervical back: Normal range of motion and neck supple.     Comments: SCDs in place  Neurological: He is alert and oriented to person, place, and time.  No asterixis  Skin: Skin is warm and dry.  +jaundice  Psychiatric: He has a normal mood and affect. His behavior is normal.   GI:  Lab Results: Recent Labs    02/24/20 1907 02/25/20 0709  WBC 21.3* 18.6*  HGB 11.7* 10.2*  HCT 32.8* 28.0*  PLT 388 327   BMET Recent Labs    02/24/20 1907 02/25/20 0709  NA 126* 125*  K 3.0* 2.5*  CL 92* 92*  CO2 24 23  GLUCOSE 109* 102*  BUN 19 17  CREATININE 0.72 0.64  CALCIUM 7.9* 7.6*   LFT Recent Labs    02/24/20 1907 02/24/20 2021 02/25/20 0709  PROT   < >  --  5.3*  ALBUMIN   < >  --  1.9*  AST   < >  --  138*  ALT   < >  --  93*  ALKPHOS   < >  --  138*  BILITOT   < >  --  24.4*  BILIDIR  --  18.2*  --    < > = values in this interval not displayed.   PT/INR Recent Labs    02/24/20 1932 02/25/20 0709  LABPROT 17.5* 18.5*  INR 1.5* 1.6*     Studies/Results: CT ABDOMEN PELVIS W CONTRAST  Result Date: 02/25/2020 CLINICAL DATA:  Jaundice, recent alcohol  detox at home EXAM: CT ABDOMEN AND PELVIS WITH CONTRAST TECHNIQUE: Multidetector CT imaging of the abdomen and pelvis was performed using the standard protocol following bolus administration of intravenous contrast. CONTRAST:  100mL OMNIPAQUE IOHEXOL 300 MG/ML  SOLN COMPARISON:  Abdominal ultrasound 02/24/2020 FINDINGS: Lower chest: Lung bases are clear. Normal heart size. No pericardial effusion. Hepatobiliary: Diffuse hepatic hypoattenuation. Asymmetric hypertrophy of the left lobe liver with a nodular surface contour. No focal liver lesion is seen. The gallbladder is largely decompressed at the time of exam. There is mild gallbladder wall thickening as seen on same day ultrasound. Additional thickening towards the gallbladder fundus is noted which is indeterminate and could reflect adherent stone, polyp or possible adenomyomatosis. There is a intraductal calcification in the cystic duct (5/85, 2/34). No biliary ductal dilatation is seen. Pancreas: Unremarkable. No pancreatic ductal dilatation. Mild adjacent peripancreatic stranding is likely due to ascites in the lesser sac. No inflammation focally centered upon the pancreas itself. Spleen: Splenomegaly.  No concerning splenic lesion. Adrenals/Urinary Tract: Normal adrenal glands. Kidneys enhance symmetrically. They are normally position. No visible or concerning renal lesion. No urolithiasis or hydronephrosis. Urinary bladder is unremarkable. Stomach/Bowel: Distal esophagus, stomach and duodenum are unremarkable. No small bowel thickening or dilatation. A normal appendix is visualized. Mild edematous thickening of the proximal colon from the cecum to the hepatic flexure likely reflects a combination of intramural fat and mild edematous changes which are nonspecific in the setting of ascites and cirrhotic features. Vascular/Lymphatic: Recanalization of the umbilical vein with anterior abdominal wall a venous collateralization. Additional upper abdominal venous  collaterals are noted. There congested upper abdominal lymph nodes as well. Focal region of mid mesenteric hazy stranding with numerous reactive appearing clustered mid mesenteric lymph nodes which could reflect some edematous mesenteric change or mesenteritis (2/54). Reproductive: The prostate and seminal vesicles are unremarkable. Other: Small to moderate volume ascites predominantly in the perihepatic and perisplenic spaces and   layering in the pelvis. No free air. Moderate body wall edema. No bowel containing hernia. Musculoskeletal: No acute osseous abnormality or suspicious osseous lesion. Multilevel degenerative changes are present in the imaged portions of the spine. IMPRESSION: 1. Diffuse hepatic hypoattenuation with a nodular surface contour and left lobe hypertrophy, compatible with cirrhosis with stigmata of portal hypertension including splenomegaly and upper abdominal venous collaterals. 2. Small to moderate volume ascites predominantly in the perihepatic and perisplenic spaces and layering in the pelvis. 3. The gallbladder is largely decompressed at the time of exam. There is mild gallbladder wall thickening as seen on same day ultrasound which is nonspecific in the setting of hepatic dysfunction. 4. Additional thickening towards the gallbladder fundus similar to comparison ultrasound. Indeterminate but may reflect adherent stone, polyp or adenomyomatosis. 5. Choledocholithiasis with intraductal calcification in the cystic duct. No biliary ductal dilatation. Correlate with LFTs. 6. Focal region of mid mesenteric hazy stranding with numerous reactive appearing clustered mid mesenteric lymph nodes which could reflect some central edematous mesenteric change or mesenteritis. 7. Mild edematous thickening of the proximal colon from the cecum to the hepatic flexure likely reflects a combination of intramural fat and mild edematous changes. Could reflect portal colopathy versus mild colitis. Correlate with  symptoms. 8. Body wall edema.  Correlate with fluid status. Electronically Signed   By: Lovena Le M.D.   On: 02/25/2020 03:08   US Abdomen Limited RUQ  Result Date: 02/24/2020 CLINICAL DATA:  Elevated bilirubin jaundice EXAM: ULTRASOUND ABDOMEN LIMITED RIGHT UPPER QUADRANT COMPARISON:  None. FINDINGS: Gallbladder: Slightly thickened gallbladder wall measuring 5.4 mm. Negative sonographic Murphy. Hyperechoic lesion in the fundus measuring 1.2 cm. Common bile duct: Diameter: 4.1 mm Liver: Liver is echogenic. Nodular hepatic contour suspicious for cirrhosis. Portal vein is patent on color Doppler imaging with normal direction of blood flow towards the liver. Other: Small amount of upper abdominal ascites IMPRESSION: 1. Cirrhotic morphology of the liver with small amount of upper abdominal ascites 2. Echogenic non mobile mass in the gallbladder fundus, could reflect adherent stone, tumefactive sludge, or less likely a polyp. Thickened gallbladder wall but without other sonographic features to suggest acute cholecystitis. Thickened gallbladder wall can be seen in the setting of cholecystitis, hepatocellular disease, or generalized edema forming states. Electronically Signed   By: Donavan Foil M.D.   On: 02/24/2020 22:30    Impression: Decompensated alcoholic cirrhosis is the most likely cause of the patient's jaundice.  MELD-Na score of 30 as of 02/25/2020.  Possible alcoholic hepatitis, as he started noticing jaundice around the time of his last drink.  Hepatic discrimant function of 54.3. -CT 02/25/20: cirrhosis with stigmata of portal hypertension including splenomegaly and upper abdominal venous collaterals.  Small to moderate volume ascites predominantly in the perihepatic and perisplenic spaces and layering in the pelvis.  There is mild gallbladder wall thickening, nonspecific in the setting of hepatic dysfunction.  Choledocholithiasis with intraductal calcification in the cystic duct. No biliary ductal  dilatation. Mild colonic thickening (portal colopathy versus mild colitis). -T bili 24.4/AST 138/ALT 93/alk phos 138, slightly improved from yesterday T bili 29.1/AST 167/ALT 103/alk phos 165.  Prior CMP on file was in 2017 with no more recent baseline labs available in EMR. -Hyponatremic with sodium of 125 -Hypokalemic with potassium of 2.5 -Renal function remains within normal limits: BUN 17/ Cr 0.64 -Albumin decreased to 1.9 -Acute hepatitis panel negative -INR 1.6  Plan: -Due to hepatic discriminant function score >32, will initiate prednisolone 54m daily -MRCP pending to further  evaluate choledocholithiasis, which may be contributing to jaundice, though there is no biliary dilation on CT and patient has no abdominal pain.  Suspect decompensated cirrhosis vs. Alcoholic hepatitis is the main cause of jaundice and hyperbilirubinemia. -Continue supportive care -Continue to trend LFTs  [facility] will follow.   LOS: 0 days   Salley Slaughter  PA-C 02/25/2020, 2:29 PM  Contact #  [phone]

## 2020-02-25 NOTE — ED Notes (Signed)
Pt waiting to finish CT scan and will then be transported to floor.

## 2020-02-25 NOTE — Plan of Care (Signed)
  Problem: Clinical Measurements: Goal: Will remain free from infection Outcome: Progressing   Problem: Clinical Measurements: Goal: Diagnostic test results will improve Outcome: Progressing   Problem: Education: Goal: Knowledge of General Education information will improve Description: Including pain rating scale, medication(s)/side effects and non-pharmacologic comfort measures Outcome: Progressing   Problem: Health Behavior/Discharge Planning: Goal: Ability to manage health-related needs will improve Outcome: Progressing   Problem: Elimination: Goal: Will not experience complications related to bowel motility Outcome: Progressing   Problem: Safety: Goal: Ability to remain free from injury will improve Outcome: Progressing

## 2020-02-25 NOTE — Progress Notes (Signed)
PROGRESS NOTE  Raymond Gomez ZOX:[id number] DOB: 1981-06-01 DOA: 02/24/2020 PCP: Alysia Penna, MD  Brief History   39 year old man presented to emergency department after referral from PCP for painless jaundice.  Completed alcohol detox at home approximately 2 weeks prior.  A & P  Painless jaundice, choledocholithiasis on CT, cardiac hepatitis versus decompensated cirrhosis with hyponatremia, coagulopathy.  CT showed diffuse hepatic hypoattenuation with nodular surface contour compatible with cirrhosis, portal hypertension, splenomegaly, upper abdominal venous collaterals.  Small to moderate volume ascites. --No obvious toxic exposure other than alcohol, Tylenol level was negative.  Gastroenterology recommended an N-acetylcysteine --MRCP pending --Total bilirubin slightly better.  Discussed with gastroenterology.  Continue present management.  Repeat laboratory studies in the morning.  Alcohol abuse --Reportedly sober for 10 days.  Monitor.  Choledocholithiasis with intraductal calcification and cystic duct, no biliary ductal dilatation seen on CT. --MRCP pending, further recommendations per GI  Echogenic nonmobile mass gallbladder fundus, could be adherent stone, tumefactive sludge, less likely polyp.  No sonographic features to suggest acute cholecystitis. --Probably secondary to hepatocellular disease.  Patient completely asymptomatic.  Suggest outpatient follow-up with GI and reimaging in the future.  Leukocytosis without fever or pain or evidence of infection --Monitor clinically  Folate deficiency normocytic anemia --Start folic acid  Hypokalemia: --Replete.  Magnesium within normal limits yesterday. --CMP in a.m.  Hypothyroidism, elevated TSH.  Minimal.  Repeat as an outpatient.  Asymptomatic. --Continue levothyroxine  Disposition Plan:  Discussion: Painless jaundice, work-up in process, home when cleared by gastroenterology.  I discussed with  gastroenterology.  Status is: Inpatient  Remains inpatient appropriate because:Ongoing diagnostic testing needed not appropriate for outpatient work up   Dispo: The patient is from: Home              Anticipated d/c is to: Home              Anticipated d/c date is: 2 days              Patient currently is not medically stable to d/c.  DVT prophylaxis: SCDs Code Status: Full Family Communication: none requested.  Patient alert, understands plan.    Brendia Sacks, MD  [facility] Direct contact: see [email or web address] (further directions at bottom of note if needed) 7PM-7AM contact night coverage as at bottom of note 02/25/2020, 11:24 AM  LOS: 0 days   Significant Hospital Events   .    Consults:  . GI   Procedures:  .   Significant Diagnostic Tests:  Marland Kitchen    Micro Data:  .    Antimicrobials:  .   Interval History/Subjective  Feels fine, no complaints.  "I would not be here if I was not yellow".  Objective   Vitals:  Vitals:   02/25/20 0614 02/25/20 0938  BP: 133/74 131/74  Pulse: 84 82  Resp: 20 18  Temp: 97.9 F (36.6 C) (!) 97.5 F (36.4 C)  SpO2: 98% 99%    Exam:  Constitutional.  Appears calm, comfortable. Respiratory.  Clear to auscultation bilaterally.  No wheezes, rales or rhonchi.  Normal respiratory effort. Cardiovascular.  Regular rate and rhythm.  No murmur, rub or gallop.  No lower extremity edema. Abdomen.  Soft, obese, nontender.  I have personally reviewed the following:   Today's Data  . Potassium 2.5, sodium 125, AST 138, ALT 93, total bilirubin 24.4, INR 1.6  Scheduled Meds: . iohexol      . potassium chloride  40 mEq Oral Q4H  . sodium chloride (PF)      .  sodium chloride flush  3 mL Intravenous Q12H   Continuous Infusions: . acetylcysteine 6.25 mg/kg/hr (02/25/20 0524)    Active Problems:   Jaundice   LOS: 0 days   How to contact the [facility] Attending or Consulting provider [place] or covering provider during after hours  [place], for this patient?  1. Check the care team in [facility] and look for a) attending/consulting [facility] provider listed and b) the [facility] team listed 2. Log into [email or web address] and use [place]'s universal password to access. If you do not have the password, please contact the hospital operator. 3. Locate the [facility] provider you are looking for under [facility] and page to a number that you can be directly reached. 4. If you still have difficulty reaching the provider, please page the [facility] (Director on Call) for the Hospitalists listed on amion for assistance.

## 2020-02-25 NOTE — Progress Notes (Signed)
MD notified of Critical Results  K+=2.5 and Total Billirubin=24.4. Results received from Tammy at [phone] and I spoke with Dr. Irene Limbo at [phone]

## 2020-02-25 NOTE — Progress Notes (Signed)
Spoke to [employer] about patient's treatment, per poison control Acetylcysteine can be stopped.

## 2020-02-26 ENCOUNTER — Inpatient Hospital Stay (HOSPITAL_COMMUNITY): Payer: BC Managed Care – PPO

## 2020-02-26 DIAGNOSIS — R17 Unspecified jaundice: Secondary | ICD-10-CM | POA: Diagnosis not present

## 2020-02-26 DIAGNOSIS — R188 Other ascites: Secondary | ICD-10-CM | POA: Diagnosis not present

## 2020-02-26 DIAGNOSIS — F1010 Alcohol abuse, uncomplicated: Secondary | ICD-10-CM | POA: Diagnosis not present

## 2020-02-26 DIAGNOSIS — K7011 Alcoholic hepatitis with ascites: Secondary | ICD-10-CM | POA: Diagnosis not present

## 2020-02-26 DIAGNOSIS — K7031 Alcoholic cirrhosis of liver with ascites: Secondary | ICD-10-CM | POA: Diagnosis not present

## 2020-02-26 LAB — COMPREHENSIVE METABOLIC PANEL
ALT: 96 U/L — ABNORMAL HIGH (ref 0–44)
AST: 144 U/L — ABNORMAL HIGH (ref 15–41)
Albumin: 1.7 g/dL — ABNORMAL LOW (ref 3.5–5.0)
Alkaline Phosphatase: 121 U/L (ref 38–126)
Anion gap: 8 (ref 5–15)
BUN: 13 mg/dL (ref 6–20)
CO2: 23 mmol/L (ref 22–32)
Calcium: 8 mg/dL — ABNORMAL LOW (ref 8.9–10.3)
Chloride: 97 mmol/L — ABNORMAL LOW (ref 98–111)
Creatinine, Ser: 0.55 mg/dL — ABNORMAL LOW (ref 0.61–1.24)
GFR calc Af Amer: >60 mL/min (ref >60)
GFR calc non Af Amer: >60 mL/min (ref >60)
Glucose, Bld: 91 mg/dL (ref 70–99)
Potassium: 3 mmol/L — ABNORMAL LOW (ref 3.5–5.1)
Sodium: 128 mmol/L — ABNORMAL LOW (ref 135–145)
Total Bilirubin: 23.4 mg/dL (ref 0.3–1.2)
Total Protein: 5.2 g/dL — ABNORMAL LOW (ref 6.5–8.1)

## 2020-02-26 LAB — PROTEIN, PLEURAL OR PERITONEAL FLUID: Total protein, fluid: <3 g/dL

## 2020-02-26 LAB — ALBUMIN, PLEURAL OR PERITONEAL FLUID: Albumin, Fluid: <1 g/dL

## 2020-02-26 LAB — BODY FLUID CELL COUNT WITH DIFFERENTIAL
Eos, Fluid: 0 %
Lymphs, Fluid: 6 %
Monocyte-Macrophage-Serous Fluid: 83 % (ref 50–90)
Neutrophil Count, Fluid: 11 % (ref 0–25)
Total Nucleated Cell Count, Fluid: 97 cu mm (ref 0–1000)

## 2020-02-26 LAB — PROTIME-INR
INR: 1.6 — ABNORMAL HIGH (ref 0.8–1.2)
Prothrombin Time: 18.4 seconds — ABNORMAL HIGH (ref 11.4–15.2)

## 2020-02-26 LAB — MAGNESIUM: Magnesium: 2.2 mg/dL (ref 1.7–2.4)

## 2020-02-26 LAB — SARS CORONAVIRUS 2 BY RT PCR (HOSPITAL ORDER, PERFORMED IN ~~LOC~~ HOSPITAL LAB): SARS Coronavirus 2: NEGATIVE

## 2020-02-26 MED ORDER — POTASSIUM CHLORIDE CRYS ER 20 MEQ PO TBCR
40.0000 meq | EXTENDED_RELEASE_TABLET | ORAL | Status: AC
  Administered 2020-02-26 (×2): 40 meq via ORAL
  Filled 2020-02-26 (×2): qty 2

## 2020-02-26 MED ORDER — LIDOCAINE HCL 1 % IJ SOLN
INTRAMUSCULAR | Status: AC
  Filled 2020-02-26: qty 20

## 2020-02-26 NOTE — Progress Notes (Signed)
PROGRESS NOTE  Raymond Gomez JQB:[id number] DOB: [birth year] DOA: 02/24/2020 PCP: Alysia Penna, MD  Brief History   39 year old man presented to emergency department after referral from PCP for painless jaundice.  Completed alcohol detox at home approximately 2 weeks prior.  A & P  Painless jaundice, choledocholithiasis on CT, alcoholic hepatitis versus decompensated cirrhosis with hyponatremia, coagulopathy.  CT showed diffuse hepatic hypoattenuation with nodular surface contour compatible with cirrhosis, portal hypertension, splenomegaly, upper abdominal venous collaterals.  Small to moderate volume ascites. --No obvious toxic exposure other than alcohol.  Treated briefly with N-acetylcysteine per gastroenterology but now discontinued.   --MRCP showed cirrhotic changes with portal venous hypertension, no dysplastic nodules or HCC.  Normal caliber and course of common bile duct.  No stone seen. --Total bilirubin slightly better.  Discussed with gastroenterology.  Repeat laboratory studies tomorrow.  Continue present management with prednisolone.  Alcohol abuse --Reportedly sober for more than 10 days.  No evidence of withdrawal, continue to monitor.  Choledocholithiasis with intraductal calcification and cystic duct, no biliary ductal dilatation seen on CT. --MRCP no evidence of stones  Echogenic nonmobile mass gallbladder fundus, could be adherent stone, tumefactive sludge, less likely polyp.  No sonographic features to suggest acute cholecystitis. --Probably secondary to hepatocellular disease.  Patient completely asymptomatic.  Suggest outpatient follow-up with GI and reimaging in the future.  Leukocytosis without fever or pain or evidence of infection --Monitor intermittently  Folate deficiency, normocytic anemia --Continue folic acid  Hypokalemia: --Replete.  Magnesium within normal limits. --CMP in a.m.  Hypothyroidism, elevated TSH.  Minimal.  Repeat as an outpatient.   Asymptomatic. --Continue levothyroxine  Disposition Plan:  Discussion: Continue treatment for painless jaundice, now status post paracentesis today.  Continue prednisolone.  Laboratory studies in the morning, if stable, can go home if cleared by gastroenterology.  Status is: Inpatient  Remains inpatient appropriate because:Ongoing diagnostic testing needed not appropriate for outpatient work up   Dispo: The patient is from: Home              Anticipated d/c is to: Home              Anticipated d/c date is: Possibly 5/29              Patient currently is not medically stable to d/c.  DVT prophylaxis: SCDs Code Status: Full Family Communication: None requested.  Patient alert, understands plan.    Brendia Sacks, MD  [facility] Direct contact: see [email or web address] (further directions at bottom of note if needed) 7PM-7AM contact night coverage as at bottom of note 02/26/2020, 4:05 PM  LOS: 1 day   Significant Hospital Events   .    Consults:  . GI   Procedures:  .   Significant Diagnostic Tests:  Marland Kitchen    Micro Data:  .    Antimicrobials:  .   Interval History/Subjective  No new rashes.  Eating fine.  No complaints.   Objective   Vitals:  Vitals:   02/26/20 1440 02/26/20 1502  BP: 116/82 136/83  Pulse:  89  Resp:  18  Temp:  98.2 F (36.8 C)  SpO2:  98%    Exam:  Constitutional.  Appears calm and comfortable. Cardiovascular.  Regular rate and rhythm.  No murmur, rub or gallop. Respiratory.  Clear to auscultation bilaterally.  No wheezes, rales or rhonchi.  Normal respiratory effort. Psychiatric.  Grossly normal mood and affect.  Speech fluent and appropriate. Abdomen.  Distended. Eyes.  Sclera icteric.  I  have personally reviewed the following:   Today's Data  . Sodium 128, potassium 3.0 . AST and ALT without significant change  Total bilirubin slightly improved to 23.4. Marland Kitchen TSH mildly elevated.  Scheduled Meds: . folic acid  1 mg Oral Daily  .  lidocaine      . prednisoLONE  40 mg Oral Daily  . sodium chloride flush  3 mL Intravenous Q12H   Continuous Infusions:   Principal Problem:   Alcoholic cirrhosis of liver with ascites (HCC) Active Problems:   Jaundice   Alcohol abuse   LOS: 1 day   How to contact the [facility] Attending or Consulting provider [place] or covering provider during after hours [place], for this patient?  1. Check the care team in [facility] and look for a) attending/consulting [facility] provider listed and b) the [facility] team listed 2. Log into [email or web address] and use [place] universal password to access. If you do not have the password, please contact the hospital operator. 3. Locate the [facility] provider you are looking for under [facility] and page to a number that you can be directly reached. 4. If you still have difficulty reaching the provider, please page the [facility] (Director on Call) for the Hospitalists listed on amion for assistance.

## 2020-02-26 NOTE — Progress Notes (Signed)
[facility] Gastroenterology Progress Note  Raymond Gomez 39 y.o. [birth year]  CC:  Jaundice, hyperbilirubinemia  Subjective: Patient denies any complaints.  He tolerated breakfast this morning and denies abdominal pain, nausea, vomiting.  ROS : Review of Systems  Respiratory: Negative for cough and shortness of breath.   Cardiovascular: Negative for chest pain and palpitations.  Gastrointestinal: Negative for abdominal pain, blood in stool, constipation, diarrhea, heartburn, melena, nausea and vomiting.   Objective: Vital signs in last 24 hours: Vitals:   02/25/20 2124 02/26/20 0634  BP: (!) 157/89 130/64  Pulse: 89 80  Resp: 20 20  Temp: 98 F (36.7 C) 98 F (36.7 C)  SpO2: 99% 98%    Physical Exam:  General:  Alert, cooperative, no distress, appears stated age  Head:  Normocephalic, without obvious abnormality, atraumatic  Eyes:  Deep icterus, EOMs intact  Lungs:   Clear to auscultation bilaterally, respirations unlabored  Heart:  Regular rate and rhythm, S1, S2 normal  Abdomen:   Soft, non-tender, mildly distended, bowel sounds active all four quadrants,  no guarding or peritoneal signs  Extremities: Extremities normal, atraumatic, no  edema  Pulses: 2+ and symmetric    Lab Results: Recent Labs    02/24/20 1907 02/24/20 2010 02/25/20 0709 02/26/20 0446  NA   < >  --  125* 128*  K   < >  --  2.5* 3.0*  CL   < >  --  92* 97*  CO2   < >  --  23 23  GLUCOSE   < >  --  102* 91  BUN   < >  --  17 13  CREATININE   < >  --  0.64 0.55*  CALCIUM   < >  --  7.6* 8.0*  MG  --  2.3  --  2.2   < > = values in this interval not displayed.   Recent Labs    02/25/20 0709 02/26/20 0446  AST 138* 144*  ALT 93* 96*  ALKPHOS 138* 121  BILITOT 24.4* 23.4*  PROT 5.3* 5.2*  ALBUMIN 1.9* 1.7*   Recent Labs    02/24/20 1907 02/25/20 0709  WBC 21.3* 18.6*  HGB 11.7* 10.2*  HCT 32.8* 28.0*  MCV 98.8 97.6  PLT 388 327   Recent Labs    02/25/20 0709 02/26/20 0446   LABPROT 18.5* 18.4*  INR 1.6* 1.6*   Impression: Decompensated alcoholic cirrhosis vs. Alcoholic hepatitis.  MELD-Na score of 29 as of 02/26/2020.  Hepatic discrimant function of 52.8 as of 02/26/20. -CT 02/25/20: cirrhosis with stigmata of portal hypertension including splenomegaly and upper abdominal venous collaterals.  Small to moderate volume ascites. -T bili 23.4/AST 144/ALT 96/alk phos 121, similar to yesterday -Hyponatremic with sodium of 128 -Hypokalemic with potassium of 3.0, improved from 2.5 yesterday -Renal function remains within normal limits: BUN 13/ Cr 0.55 -Albumin decreased to 1.7 -Acute hepatitis panel negative -INR 1.6  Choledocholithiasis with intraductal calcification in the cystic duct but biliary ductal dilatation per CT 02/25/20.  MRCP 02/25/20 showed collapsed gallbladder with wall thickening and small amount of surrounding fluid, normal caliber and course of the common bile duct. No common bile duct stones.  Plan: -Continue prednisolone 57m daily -Recommend diagnostic paracentesis. Discussed this with patient and he agrees to proceed. -Continue supportive care -Continue to trend LFTs.  Possible discharge home tomorrow pending clinical course.  [facility] will follow.  ASalley SlaughterPA-C 02/26/2020, 11:16 AM  Contact #  [phone]

## 2020-02-26 NOTE — Procedures (Signed)
PROCEDURE SUMMARY:  Successful US guided paracentesis from right lateral.  Yielded 1800 ml of dark yellow fluid.  No immediate complications.  Pt tolerated well.   Specimen sent for labs.  EBL < 35mL  Mickie Kay, NP 02/26/2020 3:24 PM

## 2020-02-27 DIAGNOSIS — K7011 Alcoholic hepatitis with ascites: Secondary | ICD-10-CM

## 2020-02-27 DIAGNOSIS — K7031 Alcoholic cirrhosis of liver with ascites: Secondary | ICD-10-CM | POA: Diagnosis not present

## 2020-02-27 DIAGNOSIS — K7290 Hepatic failure, unspecified without coma: Secondary | ICD-10-CM

## 2020-02-27 DIAGNOSIS — R17 Unspecified jaundice: Secondary | ICD-10-CM | POA: Diagnosis not present

## 2020-02-27 DIAGNOSIS — K7460 Unspecified cirrhosis of liver: Secondary | ICD-10-CM

## 2020-02-27 LAB — CBC
HCT: 27.2 % — ABNORMAL LOW (ref 39.0–52.0)
Hemoglobin: 9.7 g/dL — ABNORMAL LOW (ref 13.0–17.0)
MCH: 35.3 pg — ABNORMAL HIGH (ref 26.0–34.0)
MCHC: 35.7 g/dL (ref 30.0–36.0)
MCV: 98.9 fL (ref 80.0–100.0)
Platelets: 249 10*3/uL (ref 150–400)
RBC: 2.75 MIL/uL — ABNORMAL LOW (ref 4.22–5.81)
RDW: 16.9 % — ABNORMAL HIGH (ref 11.5–15.5)
WBC: 15.3 10*3/uL — ABNORMAL HIGH (ref 4.0–10.5)
nRBC: 0 % (ref 0.0–0.2)

## 2020-02-27 LAB — COMPREHENSIVE METABOLIC PANEL
ALT: 94 U/L — ABNORMAL HIGH (ref 0–44)
AST: 135 U/L — ABNORMAL HIGH (ref 15–41)
Albumin: 1.6 g/dL — ABNORMAL LOW (ref 3.5–5.0)
Alkaline Phosphatase: 139 U/L — ABNORMAL HIGH (ref 38–126)
Anion gap: 8 (ref 5–15)
BUN: 14 mg/dL (ref 6–20)
CO2: 22 mmol/L (ref 22–32)
Calcium: 7.8 mg/dL — ABNORMAL LOW (ref 8.9–10.3)
Chloride: 97 mmol/L — ABNORMAL LOW (ref 98–111)
Creatinine, Ser: 0.45 mg/dL — ABNORMAL LOW (ref 0.61–1.24)
GFR calc Af Amer: >60 mL/min (ref >60)
GFR calc non Af Amer: >60 mL/min (ref >60)
Glucose, Bld: 91 mg/dL (ref 70–99)
Potassium: 3.4 mmol/L — ABNORMAL LOW (ref 3.5–5.1)
Sodium: 127 mmol/L — ABNORMAL LOW (ref 135–145)
Total Bilirubin: 21.3 mg/dL (ref 0.3–1.2)
Total Protein: 4.9 g/dL — ABNORMAL LOW (ref 6.5–8.1)

## 2020-02-27 LAB — PROTIME-INR
INR: 1.6 — ABNORMAL HIGH (ref 0.8–1.2)
Prothrombin Time: 18.8 seconds — ABNORMAL HIGH (ref 11.4–15.2)

## 2020-02-27 LAB — GRAM STAIN

## 2020-02-27 MED ORDER — PREDNISONE 10 MG PO TABS
ORAL_TABLET | ORAL | 0 refills | Status: AC
Start: 2020-02-27 — End: 2020-03-26

## 2020-02-27 MED ORDER — POTASSIUM CHLORIDE CRYS ER 20 MEQ PO TBCR
40.0000 meq | EXTENDED_RELEASE_TABLET | ORAL | Status: DC
  Administered 2020-02-27: 40 meq via ORAL
  Filled 2020-02-27: qty 2

## 2020-02-27 MED ORDER — THIAMINE HCL 100 MG PO TABS
100.0000 mg | ORAL_TABLET | Freq: Every day | ORAL | Status: DC
  Administered 2020-02-27: 100 mg via ORAL
  Filled 2020-02-27: qty 1

## 2020-02-27 MED ORDER — ADULT MULTIVITAMIN W/MINERALS CH
1.0000 | ORAL_TABLET | Freq: Every day | ORAL | Status: DC
  Administered 2020-02-27: 1 via ORAL
  Filled 2020-02-27: qty 1

## 2020-02-27 MED ORDER — PREDNISOLONE 5 MG PO TABS: 40.0000 mg | ORAL_TABLET | Freq: Every day | ORAL | 0 refills | Status: DC

## 2020-02-27 MED ORDER — THIAMINE HCL 100 MG PO TABS: 100.0000 mg | ORAL_TABLET | Freq: Every day | ORAL | Status: AC

## 2020-02-27 MED ORDER — FOLIC ACID 1 MG PO TABS: 1.0000 mg | ORAL_TABLET | Freq: Every day | ORAL | Status: AC

## 2020-02-27 MED ORDER — PANTOPRAZOLE SODIUM 40 MG PO TBEC
40.0000 mg | DELAYED_RELEASE_TABLET | Freq: Every day | ORAL | 0 refills | Status: AC
Start: 2020-02-27 — End: 2020-03-28

## 2020-02-27 NOTE — Progress Notes (Signed)
[facility] Gastroenterology Progress Note  Raymond Gomez 39 y.o. 15-Apr-1981  CC:  Jaundice, hyperbilirubinemia  Subjective: Patient seen and examined at bedside.  Denies any acute issues.  Denies abdominal pain, nausea and vomiting.  Tolerating diet  ROS : Afebrile.  Negative for chest pain and shortness of breath    Objective: Vital signs in last 24 hours: Vitals:   02/26/20 2153 02/27/20 0613  BP: (!) 141/76 126/71  Pulse: 87 82  Resp: 18 18  Temp: 98.2 F (36.8 C) 97.7 F (36.5 C)  SpO2: 99% 98%    Physical Exam:  General:  Alert, cooperative, no distress, appears stated age  Head:  Normocephalic, without obvious abnormality, atraumatic  Eyes:  Deep icterus, EOMs intact  Lungs:   Clear to auscultation bilaterally, respirations unlabored  Heart:  Regular rate and rhythm, S1, S2 normal  Abdomen:   Soft, nontender, nondistended, bowel sounds active all four quadrants,  no guarding or peritoneal signs  Extremities: Extremities normal, atraumatic, no  edema       Lab Results: Recent Labs    02/24/20 2010 02/25/20 0709 02/26/20 0446 02/27/20 0456  NA  --    < > 128* 127*  K  --    < > 3.0* 3.4*  CL  --    < > 97* 97*  CO2  --    < > 23 22  GLUCOSE  --    < > 91 91  BUN  --    < > 13 14  CREATININE  --    < > 0.55* 0.45*  CALCIUM  --    < > 8.0* 7.8*  MG 2.3  --  2.2  --    < > = values in this interval not displayed.   Recent Labs    02/26/20 0446 02/27/20 0456  AST 144* 135*  ALT 96* 94*  ALKPHOS 121 139*  BILITOT 23.4* 21.3*  PROT 5.2* 4.9*  ALBUMIN 1.7* 1.6*   Recent Labs    02/25/20 0709 02/27/20 0456  WBC 18.6* 15.3*  HGB 10.2* 9.7*  HCT 28.0* 27.2*  MCV 97.6 98.9  PLT 327 249   Recent Labs    02/26/20 0446 02/27/20 0456  LABPROT 18.4* 18.8*  INR 1.6* 1.6*   Impression: -Jaundice. Most likely decompensated cirrhosis versus alcoholic hepatitis.Patient with elevated T bili in the range of 4-5 since 2017. Acute hepatitis panel  negative. Minimally elevated AST, ALT and alkaline phosphatase in the range of 100. T bili was 29.1 yesterday and trending down to 24.4 today. Discriminant function score more than 32.  Recommendations --------------------------- -Paracentesis yesterday with removal of 1.8 L of fluid.  Negative for SBP. -Total bilirubin improving while on prednisolone. -Continue prednisolone for total 28 days. -Absolute alcohol abstinence again discussed with the patient -Okay to discharge from GI standpoint on prednisolone for total 28 days. -Follow-up in GI clinic in 4 weeks.  GI will sign off.  Call us back if needed  -Imaging studies negative for CBD obstruction.  MRI showed moderate volume ascites.   Haedyn Breau PA-C 02/27/2020, 8:46 AM  Contact #  [phone]

## 2020-02-27 NOTE — Discharge Summary (Signed)
Physician Discharge Summary  Raymond Gomez WHQ:[id number] DOB: 05-Aug-1981 DOA: 02/24/2020  PCP: Alysia Penna, MD  Admit date: 02/24/2020 Discharge date: 02/27/2020  Recommendations for Outpatient Follow-up:   Painless jaundice, alcoholic hepatitis versus decompensated cirrhosis with hyponatremia, ascites, coagulopathy.   Echogenic nonmobile mass gallbladder fundus, could be adherent stone, tumefactive sludge, less likely polyp.  No sonographic features to suggest acute cholecystitis. --Probably secondary to hepatocellular disease.  Patient completely asymptomatic.  Suggest outpatient follow-up with GI  Hypothyroidism, elevated TSH. --Patient denies taking levothyroxine.  He reports he improved with weight loss and did not require any medication anymore.  Recommend repeating TSH as an outpatient to assess for need for levothyroxine.  Follow-up Information    Brahmbhatt, Parag, MD Follow up in 3 week(s).   Specialty: Gastroenterology Why: Follow-up for decompensated cirrhosis/alcoholic hepatitis Contact information: [address] [place] [address] [place] [address] [phone]            Discharge Diagnoses: Principal diagnosis is #1 1. Painless jaundice, alcoholic hepatitis versus decompensated cirrhosis with hyponatremia, ascites, coagulopathy 2. Alcohol abuse 3. Echogenic nonmobile mass gallbladder fundus, could be adherent stone, tumefactive sludge, less likely polyp 4. Folate deficiency, normocytic anemia 5. Hypothyroidism, elevated TSH  Discharge Condition: improved Disposition: home  Diet recommendation: activity as tolerated  Filed Weights   02/24/20 2215 02/25/20 0306 02/27/20 3009  Weight: 113.4 kg 120.2 kg 125.1 kg    History of present illness:  39 year old man presented to emergency department after referral from PCP for painless jaundice.  Completed alcohol detox at home approximately 2 weeks prior.  Hospital Course:  Patient was seen by  gastroenterology, was started on prednisolone for alcoholic hepatitis and was closely monitored.  Gradual clinical improvement was noted, per gastroenterology stable for discharge on prednisone taper.  (Initially discharged on prednisone, however patient's insurance did not cover and would have cost $4000, therefore discussed with Dr. Levora Angel who recommended prednisone taper as below).  Painless jaundice, alcoholic hepatitis versus decompensated cirrhosis with hyponatremia, ascites, coagulopathy.  CT showed diffuse hepatic hypoattenuation with nodular surface contour compatible with cirrhosis, portal hypertension, splenomegaly, upper abdominal venous collaterals.  Small to moderate volume ascites. --No obvious toxic exposure other than alcohol.  Treated briefly with N-acetylcysteine per gastroenterology but now discontinued.   --MRCP showed cirrhotic changes with portal venous hypertension, no dysplastic nodules or HCC.  Normal caliber and course of common bile duct.  No stone seen. --Total bilirubin slowly trending down.    Alcohol abuse --Reportedly sober now for approximately 10 days or more prior to admission.  No evidence of withdrawal, continue to monitor.  Echogenic nonmobile mass gallbladder fundus, could be adherent stone, tumefactive sludge, less likely polyp.  No sonographic features to suggest acute cholecystitis. --Probably secondary to hepatocellular disease.  Patient completely asymptomatic.  Suggest outpatient follow-up with GI   Folate deficiency, normocytic anemia --Continue folic acid  Hypothyroidism, elevated TSH.  Minimal.  Repeat as an outpatient.  Asymptomatic. --Patient denies taking levothyroxine.  He reports he improved with weight loss and did not require any medication anymore.  Recommend repeating TSH as an outpatient to assess for need for levothyroxine.  Consults:  . GI   Today's assessment: S: Feels fine today, no complaints. O: Vitals:  Vitals:    02/26/20 2153 02/27/20 0613  BP: (!) 141/76 126/71  Pulse: 87 82  Resp: 18 18  Temp: 98.2 F (36.8 C) 97.7 F (36.5 C)  SpO2: 99% 98%    Constitutional:  . Appears calm and  comfortable Respiratory:  . CTA bilaterally, no w/r/r.  . Respiratory effort normal.  Cardiovascular:  . RRR, no m/r/g . No LE extremity edema   Psychiatric:  . Mental status o Mood, affect appropriate  Sodium stable at 127. Total bilirubin slightly down to 21.3 AST and ALT slightly lower. CBC stable.  Discharge Instructions  Discharge Instructions    Activity as tolerated - No restrictions   Complete by: As directed    Diet - low sodium heart healthy   Complete by: As directed    Discharge instructions   Complete by: As directed    Call your physician or seek immediate medical attention for swelling, pain, confusion, bleeding, worsening yellowness or worsening of condition. Absolutely no alcohol or unprescribed drugs.    Note that the patient was not taking atorvastatin, levothyroxine, metoprolol or trazodone per his report. Allergies as of 02/27/2020   No Known Allergies     Medication List    STOP taking these medications   atorvastatin 40 MG tablet Commonly known as: LIPITOR   levothyroxine 100 MCG tablet Commonly known as: SYNTHROID   metoprolol succinate 50 MG 24 hr tablet Commonly known as: TOPROL-XL   traZODone 100 MG tablet Commonly known as: DESYREL   vitamin C 500 MG tablet Commonly known as: ASCORBIC ACID     TAKE these medications   folic acid 1 MG tablet Commonly known as: FOLVITE Take 1 tablet (1 mg total) by mouth daily. Start taking on: Feb 28, 2020   multivitamin with minerals Tabs tablet Take 1 tablet by mouth daily.   pantoprazole 40 MG tablet Commonly known as: Protonix Take 1 tablet (40 mg total) by mouth daily.   predniSONE 10 MG tablet Commonly known as: DELTASONE Take 4 tablets (40 mg total) by mouth daily for 14 days, THEN 3 tablets (30 mg total)  daily for 7 days, THEN 2 tablets (20 mg total) daily for 7 days. Start taking on: Feb 27, 2020   thiamine 100 MG tablet Take 1 tablet (100 mg total) by mouth daily. Start taking on: Feb 28, 2020      No Known Allergies  The results of significant diagnostics from this hospitalization (including imaging, microbiology, ancillary and laboratory) are listed below for reference.    Significant Diagnostic Studies: CT ABDOMEN PELVIS W CONTRAST  Result Date: 02/25/2020 CLINICAL DATA:  Jaundice, recent alcohol detox at home EXAM: CT ABDOMEN AND PELVIS WITH CONTRAST TECHNIQUE: Multidetector CT imaging of the abdomen and pelvis was performed using the standard protocol following bolus administration of intravenous contrast. CONTRAST:  OMNIPAQUE IOHEXOL 300 MG/ML  SOLN COMPARISON:  Abdominal ultrasound 02/24/2020 FINDINGS: Lower chest: Lung bases are clear. Normal heart size. No pericardial effusion. Hepatobiliary: Diffuse hepatic hypoattenuation. Asymmetric hypertrophy of the left lobe liver with a nodular surface contour. No focal liver lesion is seen. The gallbladder is largely decompressed at the time of exam. There is mild gallbladder wall thickening as seen on same day ultrasound. Additional thickening towards the gallbladder fundus is noted which is indeterminate and could reflect adherent stone, polyp or possible adenomyomatosis. There is a intraductal calcification in the cystic duct (5/85, 2/34). No biliary ductal dilatation is seen. Pancreas: Unremarkable. No pancreatic ductal dilatation. Mild adjacent peripancreatic stranding is likely due to ascites in the lesser sac. No inflammation focally centered upon the pancreas itself. Spleen: Splenomegaly.  No concerning splenic lesion. Adrenals/Urinary Tract: Normal adrenal glands. Kidneys enhance symmetrically. They are normally position. No visible or concerning renal lesion. No  urolithiasis or hydronephrosis. Urinary bladder is unremarkable.  Stomach/Bowel: Distal esophagus, stomach and duodenum are unremarkable. No small bowel thickening or dilatation. A normal appendix is visualized. Mild edematous thickening of the proximal colon from the cecum to the hepatic flexure likely reflects a combination of intramural fat and mild edematous changes which are nonspecific in the setting of ascites and cirrhotic features. Vascular/Lymphatic: Recanalization of the umbilical vein with anterior abdominal wall a venous collateralization. Additional upper abdominal venous collaterals are noted. There congested upper abdominal lymph nodes as well. Focal region of mid mesenteric hazy stranding with numerous reactive appearing clustered mid mesenteric lymph nodes which could reflect some edematous mesenteric change or mesenteritis (2/54). Reproductive: The prostate and seminal vesicles are unremarkable. Other: Small to moderate volume ascites predominantly in the perihepatic and perisplenic spaces and layering in the pelvis. No free air. Moderate body wall edema. No bowel containing hernia. Musculoskeletal: No acute osseous abnormality or suspicious osseous lesion. Multilevel degenerative changes are present in the imaged portions of the spine. IMPRESSION: 1. Diffuse hepatic hypoattenuation with a nodular surface contour and left lobe hypertrophy, compatible with cirrhosis with stigmata of portal hypertension including splenomegaly and upper abdominal venous collaterals. 2. Small to moderate volume ascites predominantly in the perihepatic and perisplenic spaces and layering in the pelvis. 3. The gallbladder is largely decompressed at the time of exam. There is mild gallbladder wall thickening as seen on same day ultrasound which is nonspecific in the setting of hepatic dysfunction. 4. Additional thickening towards the gallbladder fundus similar to comparison ultrasound. Indeterminate but may reflect adherent stone, polyp or adenomyomatosis. 5. Choledocholithiasis with  intraductal calcification in the cystic duct. No biliary ductal dilatation. Correlate with LFTs. 6. Focal region of mid mesenteric hazy stranding with numerous reactive appearing clustered mid mesenteric lymph nodes which could reflect some central edematous mesenteric change or mesenteritis. 7. Mild edematous thickening of the proximal colon from the cecum to the hepatic flexure likely reflects a combination of intramural fat and mild edematous changes. Could reflect portal colopathy versus mild colitis. Correlate with symptoms. 8. Body wall edema.  Correlate with fluid status. Electronically Signed   By: Kreg Shropshire M.D.   On: 02/25/2020 03:08   MR 3D Recon At Scanner  Result Date: 02/25/2020 CLINICAL DATA:  Abdominal pain and jaundiced. EXAM: MRI ABDOMEN WITHOUT AND WITH CONTRAST (INCLUDING MRCP) TECHNIQUE: Multiplanar multisequence MR imaging of the abdomen was performed both before and after the administration of intravenous contrast. Heavily T2-weighted images of the biliary and pancreatic ducts were obtained, and three-dimensional MRCP images were rendered by post processing. CONTRAST:  65mL GADAVIST GADOBUTROL 1 MMOL/ML IV SOLN COMPARISON:  CT scan 02/25/2020 FINDINGS: Lower chest: The lung bases are grossly clear. No pleural effusions or pulmonary lesions. The heart is normal in size. No pericardial effusion. Hepatobiliary: There are cirrhotic changes involving the liver as seen on the CT scan findings of portal venous hypertension with portal venous collaterals and esophageal varices. No early arterial phase enhancing lesions are identified to suggest dysplastic nodules or HCC. Regenerating nodules are noted throughout the liver. No intra or extrahepatic biliary dilatation. Normal caliber and course of the common bile duct. No common bile duct stones. The gallbladder is collapsed and surrounded by small amount of fluid. Pancreas:  No mass, inflammation or ductal dilatation. Spleen:  Moderate  splenomegaly.  No splenic lesions. Adrenals/Urinary Tract: The adrenal glands and kidneys are unremarkable. Stomach/Bowel: The stomach, duodenum, visualized small bowel and visualized colon are grossly normal. Vascular/Lymphatic: The  aorta is normal in caliber. The branch vessels are patent. The major venous structures are patent. The portal and splenic veins are patent. Portal venous collaterals are noted. Scattered upper abdominal lymph nodes, typical with cirrhosis. No mass or overt adenopathy. Other:  Small to moderate volume abdominal ascites. Musculoskeletal: No significant bony findings. IMPRESSION: 1. Cirrhotic changes involving the liver as seen on the CT scan with findings of portal venous hypertension with portal venous collaterals. No early arterial phase enhancing lesions to suggest dysplastic nodules or HCC. 2. Collapsed gallbladder with wall thickening and small amount of surrounding fluid. 3. Normal caliber and course of the common bile duct. No common bile duct stones. 4. Small to moderate volume abdominal ascites. Electronically Signed   By: Rudie Meyer M.D.   On: 02/25/2020 17:13   US Paracentesis  Result Date: 02/26/2020 INDICATION: Patient admitted for decompensated alcoholic cirrhosis versus alcoholic hepatitis. Patient presents with large volume ascites. Interventional radiology asked to perform a therapeutic and diagnostic paracentesis. EXAM: ULTRASOUND GUIDED right PARACENTESIS MEDICATIONS: 1% lidocaine 10 mL COMPLICATIONS: None immediate PROCEDURE: Informed written consent was obtained from the patient after a discussion of the risks, benefits and alternatives to treatment. A timeout was performed prior to the initiation of the procedure. Initial ultrasound scanning demonstrates a large amount of ascites within the right lower abdominal quadrant. The right lower abdomen was prepped and draped in the usual sterile fashion. 1% lidocaine was used for local anesthesia. Following this, a  19 gauge, 7-cm, Yueh catheter was introduced. An ultrasound image was saved for documentation purposes. The paracentesis was performed. The catheter was removed and a dressing was applied. The patient tolerated the procedure well without immediate post procedural complication. FINDINGS: A total of approximately 1800 mL of dark yellow fluid was removed. Samples were sent to the laboratory as requested by the clinical team. IMPRESSION: Successful ultrasound-guided paracentesis yielding 1800 mL liters of peritoneal fluid. Read by: Alwyn Ren, NP Electronically Signed   By: Gilmer Mor D.O.   On: 02/26/2020 15:22   MR ABDOMEN MRCP W WO CONTAST  Result Date: 02/25/2020 CLINICAL DATA:  Abdominal pain and jaundiced. EXAM: MRI ABDOMEN WITHOUT AND WITH CONTRAST (INCLUDING MRCP) TECHNIQUE: Multiplanar multisequence MR imaging of the abdomen was performed both before and after the administration of intravenous contrast. Heavily T2-weighted images of the biliary and pancreatic ducts were obtained, and three-dimensional MRCP images were rendered by post processing. CONTRAST:  10mL GADAVIST GADOBUTROL 1 MMOL/ML IV SOLN COMPARISON:  CT scan 02/25/2020 FINDINGS: Lower chest: The lung bases are grossly clear. No pleural effusions or pulmonary lesions. The heart is normal in size. No pericardial effusion. Hepatobiliary: There are cirrhotic changes involving the liver as seen on the CT scan findings of portal venous hypertension with portal venous collaterals and esophageal varices. No early arterial phase enhancing lesions are identified to suggest dysplastic nodules or HCC. Regenerating nodules are noted throughout the liver. No intra or extrahepatic biliary dilatation. Normal caliber and course of the common bile duct. No common bile duct stones. The gallbladder is collapsed and surrounded by small amount of fluid. Pancreas:  No mass, inflammation or ductal dilatation. Spleen:  Moderate splenomegaly.  No splenic lesions.  Adrenals/Urinary Tract: The adrenal glands and kidneys are unremarkable. Stomach/Bowel: The stomach, duodenum, visualized small bowel and visualized colon are grossly normal. Vascular/Lymphatic: The aorta is normal in caliber. The branch vessels are patent. The major venous structures are patent. The portal and splenic veins are patent. Portal venous collaterals are noted.  Scattered upper abdominal lymph nodes, typical with cirrhosis. No mass or overt adenopathy. Other:  Small to moderate volume abdominal ascites. Musculoskeletal: No significant bony findings. IMPRESSION: 1. Cirrhotic changes involving the liver as seen on the CT scan with findings of portal venous hypertension with portal venous collaterals. No early arterial phase enhancing lesions to suggest dysplastic nodules or HCC. 2. Collapsed gallbladder with wall thickening and small amount of surrounding fluid. 3. Normal caliber and course of the common bile duct. No common bile duct stones. 4. Small to moderate volume abdominal ascites. Electronically Signed   By: Marijo Sanes M.D.   On: 02/25/2020 17:13   US Abdomen Limited RUQ  Result Date: 02/24/2020 CLINICAL DATA:  Elevated bilirubin jaundice EXAM: ULTRASOUND ABDOMEN LIMITED RIGHT UPPER QUADRANT COMPARISON:  None. FINDINGS: Gallbladder: Slightly thickened gallbladder wall measuring 5.4 mm. Negative sonographic Murphy. Hyperechoic lesion in the fundus measuring 1.2 cm. Common bile duct: Diameter: 4.1 mm Liver: Liver is echogenic. Nodular hepatic contour suspicious for cirrhosis. Portal vein is patent on color Doppler imaging with normal direction of blood flow towards the liver. Other: Small amount of upper abdominal ascites IMPRESSION: 1. Cirrhotic morphology of the liver with small amount of upper abdominal ascites 2. Echogenic non mobile mass in the gallbladder fundus, could reflect adherent stone, tumefactive sludge, or less likely a polyp. Thickened gallbladder wall but without other  sonographic features to suggest acute cholecystitis. Thickened gallbladder wall can be seen in the setting of cholecystitis, hepatocellular disease, or generalized edema forming states. Electronically Signed   By: Donavan Foil M.D.   On: 02/24/2020 22:30    Microbiology: Recent Results (from the past 240 hour(s))  SARS Coronavirus 2 by RT PCR (hospital order, performed in [facility] hospital lab) Nasopharyngeal Nasopharyngeal Swab     Status: None   Collection Time: 02/24/20  8:24 PM   Specimen: Nasopharyngeal Swab  Result Value Ref Range Status   SARS Coronavirus 2 NEGATIVE NEGATIVE Final    Comment: (NOTE) SARS-CoV-2 target nucleic acids are NOT DETECTED. The SARS-CoV-2 RNA is generally detectable in upper and lower respiratory specimens during the acute phase of infection. The lowest concentration of SARS-CoV-2 viral copies this assay can detect is 250 copies / mL. A negative result does not preclude SARS-CoV-2 infection and should not be used as the sole basis for treatment or other patient management decisions.  A negative result may occur with improper specimen collection / handling, submission of specimen other than nasopharyngeal swab, presence of viral mutation(s) within the areas targeted by this assay, and inadequate number of viral copies (<250 copies / mL). A negative result must be combined with clinical observations, patient history, and epidemiological information. Fact Sheet for Patients:   StrictlyIdeas.no Fact Sheet for Healthcare Providers: BankingDealers.co.za This test is not yet approved or cleared  by the [place] FDA and has been authorized for detection and/or diagnosis of SARS-CoV-2 by FDA under an Emergency Use Authorization (EUA).  This EUA will remain in effect (meaning this test can be used) for the duration of the COVID-19 declaration under Section 564(b)(1) of the Act, 21 U.S.C. section  360bbb-3(b)(1), unless the authorization is terminated or revoked sooner. Performed at [facility], [place] [address], [place], [place] [address]   Culture, blood (routine x 2)     Status: None (Preliminary result)   Collection Time: 02/24/20  8:25 PM   Specimen: BLOOD  Result Value Ref Range Status   Specimen Description   Final  BLOOD BLOOD LEFT FOREARM Performed at [facility], [address] [place], [place] [address]    Special Requests   Final    BOTTLES DRAWN AEROBIC AND ANAEROBIC Blood Culture adequate volume Performed at [facility], [address] [place], [place] [address]    Culture   Final    NO GROWTH 3 DAYS Performed at [facility], [address] [place], [place] [address]    Report Status PENDING  Incomplete  Culture, blood (routine x 2)     Status: None (Preliminary result)   Collection Time: 02/24/20  8:30 PM   Specimen: BLOOD  Result Value Ref Range Status   Specimen Description   Final    BLOOD RIGHT ANTECUBITAL Performed at [facility], [address] [place], [place] [address]    Special Requests   Final    BOTTLES DRAWN AEROBIC AND ANAEROBIC Blood Culture adequate volume Performed at [facility], [address] [place], [place] [address]    Culture   Final    NO GROWTH 3 DAYS Performed at [facility], [address] [place], [place] [address]    Report Status PENDING  Incomplete  Culture, body fluid-bottle     Status: None (Preliminary result)   Collection Time: 02/26/20  3:12 PM   Specimen: Peritoneal Washings  Result Value Ref Range Status   Specimen Description PERITONEAL  Final   Special Requests NONE  Final   Culture   Final    NO GROWTH < 12 HOURS Performed at [facility], [address] [place], [place] [address]    Report Status PENDING  Incomplete  Gram stain     Status: None   Collection Time: 02/26/20  3:12  PM   Specimen: Peritoneal Washings  Result Value Ref Range Status   Specimen Description PERITONEAL  Final   Special Requests NONE  Final   Gram Stain   Final    WBC PRESENT, PREDOMINANTLY MONONUCLEAR NO ORGANISMS SEEN CYTOSPIN SMEAR Performed at [facility], [address] [place], [place] [address]    Report Status 02/27/2020 FINAL  Final     Labs: Basic Metabolic Panel: Recent Labs  Lab 02/24/20 1907 02/24/20 2010 02/25/20 0709 02/26/20 0446 02/27/20 0456  NA 126*  --  125* 128* 127*  K 3.0*  --  2.5* 3.0* 3.4*  CL 92*  --  92* 97* 97*  CO2 24  --  23 23 22   GLUCOSE 109*  --  102* 91 91  BUN 19  --  17 13 14   CREATININE 0.72  --  0.64 0.55* 0.45*  CALCIUM 7.9*  --  7.6* 8.0* 7.8*  MG  --  2.3  --  2.2  --    Liver Function Tests: Recent Labs  Lab 02/24/20 1907 02/25/20 0709 02/26/20 0446 02/27/20 0456  AST 167* 138* 144* 135*  ALT 103* 93* 96* 94*  ALKPHOS 165* 138* 121 139*  BILITOT 29.1* 24.4* 23.4* 21.3*  PROT 6.0* 5.3* 5.2* 4.9*  ALBUMIN 2.1* 1.9* 1.7* 1.6*   Recent Labs  Lab 02/24/20 1907  LIPASE 71*   Recent Labs  Lab 02/24/20 2021  AMMONIA 26   CBC: Recent Labs  Lab 02/24/20 1907 02/25/20 0709 02/27/20 0456  WBC 21.3* 18.6* 15.3*  HGB 11.7* 10.2* 9.7*  HCT 32.8* 28.0* 27.2*  MCV 98.8 97.6 98.9  PLT 388 327 249    Principal Problem:  Alcoholic cirrhosis of liver with ascites (HCC) Active Problems:   Jaundice   Alcohol abuse   Time coordinating discharge: 35 minutes  Signed:  Brendia Sacks, MD  [facility]  02/27/2020, 3:11 PM

## 2020-02-27 NOTE — Progress Notes (Signed)
Discharge instructions given to pt and all questions were answered.  

## 2020-02-29 LAB — CULTURE, BLOOD (ROUTINE X 2)
Culture: NO GROWTH
Culture: NO GROWTH
Special Requests: ADEQUATE
Special Requests: ADEQUATE

## 2020-03-02 DIAGNOSIS — R17 Unspecified jaundice: Secondary | ICD-10-CM | POA: Diagnosis not present

## 2020-03-02 DIAGNOSIS — F1010 Alcohol abuse, uncomplicated: Secondary | ICD-10-CM | POA: Diagnosis not present

## 2020-03-02 DIAGNOSIS — D649 Anemia, unspecified: Secondary | ICD-10-CM | POA: Diagnosis not present

## 2020-03-02 DIAGNOSIS — D490 Neoplasm of unspecified behavior of digestive system: Secondary | ICD-10-CM | POA: Diagnosis not present

## 2020-03-02 LAB — CULTURE, BODY FLUID W GRAM STAIN -BOTTLE: Culture: NO GROWTH

## 2020-03-02 LAB — CYTOLOGY - NON PAP

## 2020-03-16 DIAGNOSIS — R17 Unspecified jaundice: Secondary | ICD-10-CM | POA: Diagnosis not present

## 2020-05-04 DIAGNOSIS — E7849 Other hyperlipidemia: Secondary | ICD-10-CM | POA: Diagnosis not present

## 2020-05-04 DIAGNOSIS — I10 Essential (primary) hypertension: Secondary | ICD-10-CM | POA: Diagnosis not present

## 2020-05-04 DIAGNOSIS — Z0000 Encounter for general adult medical examination without abnormal findings: Secondary | ICD-10-CM | POA: Diagnosis not present

## 2020-05-04 DIAGNOSIS — E039 Hypothyroidism, unspecified: Secondary | ICD-10-CM | POA: Diagnosis not present

## 2020-05-10 DIAGNOSIS — E039 Hypothyroidism, unspecified: Secondary | ICD-10-CM | POA: Diagnosis not present

## 2020-05-10 DIAGNOSIS — H6120 Impacted cerumen, unspecified ear: Secondary | ICD-10-CM | POA: Diagnosis not present

## 2020-05-10 DIAGNOSIS — Z0000 Encounter for general adult medical examination without abnormal findings: Secondary | ICD-10-CM | POA: Diagnosis not present

## 2020-05-10 DIAGNOSIS — R82998 Other abnormal findings in urine: Secondary | ICD-10-CM | POA: Diagnosis not present

## 2020-05-23 DIAGNOSIS — L400 Psoriasis vulgaris: Secondary | ICD-10-CM | POA: Diagnosis not present

## 2020-08-13 DIAGNOSIS — Z20822 Contact with and (suspected) exposure to covid-19: Secondary | ICD-10-CM | POA: Diagnosis not present

## 2023-06-25 DIAGNOSIS — F1020 Alcohol dependence, uncomplicated: Secondary | ICD-10-CM | POA: Diagnosis not present

## 2023-06-26 DIAGNOSIS — F1020 Alcohol dependence, uncomplicated: Secondary | ICD-10-CM | POA: Diagnosis not present

## 2023-06-26 DIAGNOSIS — F331 Major depressive disorder, recurrent, moderate: Secondary | ICD-10-CM | POA: Diagnosis not present

## 2023-06-26 DIAGNOSIS — R55 Syncope and collapse: Secondary | ICD-10-CM | POA: Diagnosis not present

## 2023-06-26 DIAGNOSIS — F1220 Cannabis dependence, uncomplicated: Secondary | ICD-10-CM | POA: Diagnosis not present

## 2023-06-26 DIAGNOSIS — R569 Unspecified convulsions: Secondary | ICD-10-CM | POA: Diagnosis not present

## 2023-06-27 DIAGNOSIS — D699 Hemorrhagic condition, unspecified: Secondary | ICD-10-CM | POA: Diagnosis not present

## 2023-06-27 DIAGNOSIS — S3991XA Unspecified injury of abdomen, initial encounter: Secondary | ICD-10-CM | POA: Diagnosis not present

## 2023-06-27 DIAGNOSIS — D62 Acute posthemorrhagic anemia: Secondary | ICD-10-CM | POA: Diagnosis not present

## 2023-06-27 DIAGNOSIS — D649 Anemia, unspecified: Secondary | ICD-10-CM | POA: Diagnosis not present

## 2023-06-27 DIAGNOSIS — F17290 Nicotine dependence, other tobacco product, uncomplicated: Secondary | ICD-10-CM | POA: Diagnosis not present

## 2023-06-27 DIAGNOSIS — K828 Other specified diseases of gallbladder: Secondary | ICD-10-CM | POA: Diagnosis not present

## 2023-06-27 DIAGNOSIS — K8020 Calculus of gallbladder without cholecystitis without obstruction: Secondary | ICD-10-CM | POA: Diagnosis not present

## 2023-06-27 DIAGNOSIS — Z7141 Alcohol abuse counseling and surveillance of alcoholic: Secondary | ICD-10-CM | POA: Diagnosis not present

## 2023-06-27 DIAGNOSIS — R748 Abnormal levels of other serum enzymes: Secondary | ICD-10-CM | POA: Diagnosis not present

## 2023-06-27 DIAGNOSIS — R7989 Other specified abnormal findings of blood chemistry: Secondary | ICD-10-CM | POA: Diagnosis not present

## 2023-06-27 DIAGNOSIS — R161 Splenomegaly, not elsewhere classified: Secondary | ICD-10-CM | POA: Diagnosis not present

## 2023-06-27 DIAGNOSIS — R55 Syncope and collapse: Secondary | ICD-10-CM | POA: Diagnosis not present

## 2023-06-27 DIAGNOSIS — G4700 Insomnia, unspecified: Secondary | ICD-10-CM | POA: Diagnosis not present

## 2023-06-27 DIAGNOSIS — S3993XA Unspecified injury of pelvis, initial encounter: Secondary | ICD-10-CM | POA: Diagnosis not present

## 2023-06-27 DIAGNOSIS — W010XXA Fall on same level from slipping, tripping and stumbling without subsequent striking against object, initial encounter: Secondary | ICD-10-CM | POA: Diagnosis not present

## 2023-06-27 DIAGNOSIS — W1839XA Other fall on same level, initial encounter: Secondary | ICD-10-CM | POA: Diagnosis not present

## 2023-06-27 DIAGNOSIS — R7401 Elevation of levels of liver transaminase levels: Secondary | ICD-10-CM | POA: Diagnosis not present

## 2023-06-27 DIAGNOSIS — S300XXA Contusion of lower back and pelvis, initial encounter: Secondary | ICD-10-CM | POA: Diagnosis not present

## 2023-06-27 DIAGNOSIS — D696 Thrombocytopenia, unspecified: Secondary | ICD-10-CM | POA: Diagnosis not present

## 2023-06-28 DIAGNOSIS — D699 Hemorrhagic condition, unspecified: Secondary | ICD-10-CM | POA: Diagnosis not present

## 2023-06-28 DIAGNOSIS — D62 Acute posthemorrhagic anemia: Secondary | ICD-10-CM | POA: Diagnosis not present

## 2023-06-28 DIAGNOSIS — S300XXA Contusion of lower back and pelvis, initial encounter: Secondary | ICD-10-CM | POA: Diagnosis not present

## 2023-06-29 DIAGNOSIS — S300XXA Contusion of lower back and pelvis, initial encounter: Secondary | ICD-10-CM | POA: Diagnosis not present

## 2023-06-29 DIAGNOSIS — D62 Acute posthemorrhagic anemia: Secondary | ICD-10-CM | POA: Diagnosis not present

## 2023-06-29 DIAGNOSIS — D699 Hemorrhagic condition, unspecified: Secondary | ICD-10-CM | POA: Diagnosis not present

## 2023-06-30 DIAGNOSIS — D699 Hemorrhagic condition, unspecified: Secondary | ICD-10-CM | POA: Diagnosis not present

## 2023-06-30 DIAGNOSIS — S300XXA Contusion of lower back and pelvis, initial encounter: Secondary | ICD-10-CM | POA: Diagnosis not present

## 2023-06-30 DIAGNOSIS — D62 Acute posthemorrhagic anemia: Secondary | ICD-10-CM | POA: Diagnosis not present

## 2023-07-01 DIAGNOSIS — D62 Acute posthemorrhagic anemia: Secondary | ICD-10-CM | POA: Diagnosis not present

## 2023-07-01 DIAGNOSIS — S300XXA Contusion of lower back and pelvis, initial encounter: Secondary | ICD-10-CM | POA: Diagnosis not present

## 2023-07-01 DIAGNOSIS — D696 Thrombocytopenia, unspecified: Secondary | ICD-10-CM | POA: Diagnosis not present

## 2023-07-02 DIAGNOSIS — S300XXA Contusion of lower back and pelvis, initial encounter: Secondary | ICD-10-CM | POA: Diagnosis not present

## 2023-07-02 DIAGNOSIS — D699 Hemorrhagic condition, unspecified: Secondary | ICD-10-CM | POA: Diagnosis not present

## 2023-07-02 DIAGNOSIS — D62 Acute posthemorrhagic anemia: Secondary | ICD-10-CM | POA: Diagnosis not present

## 2023-07-03 DIAGNOSIS — S300XXA Contusion of lower back and pelvis, initial encounter: Secondary | ICD-10-CM | POA: Diagnosis not present

## 2023-07-03 DIAGNOSIS — D62 Acute posthemorrhagic anemia: Secondary | ICD-10-CM | POA: Diagnosis not present

## 2023-07-03 DIAGNOSIS — D696 Thrombocytopenia, unspecified: Secondary | ICD-10-CM | POA: Diagnosis not present

## 2023-07-05 DIAGNOSIS — F1020 Alcohol dependence, uncomplicated: Secondary | ICD-10-CM | POA: Diagnosis not present

## 2023-07-06 DIAGNOSIS — F1020 Alcohol dependence, uncomplicated: Secondary | ICD-10-CM | POA: Diagnosis not present

## 2023-07-07 DIAGNOSIS — F1020 Alcohol dependence, uncomplicated: Secondary | ICD-10-CM | POA: Diagnosis not present

## 2023-07-08 DIAGNOSIS — F1020 Alcohol dependence, uncomplicated: Secondary | ICD-10-CM | POA: Diagnosis not present

## 2023-07-09 DIAGNOSIS — F1020 Alcohol dependence, uncomplicated: Secondary | ICD-10-CM | POA: Diagnosis not present

## 2023-07-10 DIAGNOSIS — F1020 Alcohol dependence, uncomplicated: Secondary | ICD-10-CM | POA: Diagnosis not present

## 2023-07-11 DIAGNOSIS — F1020 Alcohol dependence, uncomplicated: Secondary | ICD-10-CM | POA: Diagnosis not present

## 2023-07-12 DIAGNOSIS — F1020 Alcohol dependence, uncomplicated: Secondary | ICD-10-CM | POA: Diagnosis not present

## 2023-07-13 DIAGNOSIS — F1020 Alcohol dependence, uncomplicated: Secondary | ICD-10-CM | POA: Diagnosis not present

## 2023-07-14 DIAGNOSIS — F1020 Alcohol dependence, uncomplicated: Secondary | ICD-10-CM | POA: Diagnosis not present

## 2023-07-15 DIAGNOSIS — F1020 Alcohol dependence, uncomplicated: Secondary | ICD-10-CM | POA: Diagnosis not present

## 2023-07-23 DIAGNOSIS — F1020 Alcohol dependence, uncomplicated: Secondary | ICD-10-CM | POA: Diagnosis not present

## 2023-07-23 DIAGNOSIS — Z7141 Alcohol abuse counseling and surveillance of alcoholic: Secondary | ICD-10-CM | POA: Diagnosis not present

## 2023-07-23 DIAGNOSIS — F1120 Opioid dependence, uncomplicated: Secondary | ICD-10-CM | POA: Diagnosis not present

## 2023-07-25 DIAGNOSIS — Z7141 Alcohol abuse counseling and surveillance of alcoholic: Secondary | ICD-10-CM | POA: Diagnosis not present

## 2023-07-25 DIAGNOSIS — F1020 Alcohol dependence, uncomplicated: Secondary | ICD-10-CM | POA: Diagnosis not present

## 2023-08-05 DIAGNOSIS — Z7141 Alcohol abuse counseling and surveillance of alcoholic: Secondary | ICD-10-CM | POA: Diagnosis not present

## 2023-08-05 DIAGNOSIS — F1020 Alcohol dependence, uncomplicated: Secondary | ICD-10-CM | POA: Diagnosis not present

## 2023-08-07 DIAGNOSIS — F1020 Alcohol dependence, uncomplicated: Secondary | ICD-10-CM | POA: Diagnosis not present

## 2023-08-07 DIAGNOSIS — F419 Anxiety disorder, unspecified: Secondary | ICD-10-CM | POA: Diagnosis not present

## 2023-08-07 DIAGNOSIS — F338 Other recurrent depressive disorders: Secondary | ICD-10-CM | POA: Diagnosis not present

## 2023-09-03 DIAGNOSIS — F1020 Alcohol dependence, uncomplicated: Secondary | ICD-10-CM | POA: Diagnosis not present

## 2023-09-03 DIAGNOSIS — F338 Other recurrent depressive disorders: Secondary | ICD-10-CM | POA: Diagnosis not present

## 2023-09-03 DIAGNOSIS — F419 Anxiety disorder, unspecified: Secondary | ICD-10-CM | POA: Diagnosis not present

## 2023-10-29 DIAGNOSIS — F419 Anxiety disorder, unspecified: Secondary | ICD-10-CM | POA: Diagnosis not present

## 2023-10-29 DIAGNOSIS — F338 Other recurrent depressive disorders: Secondary | ICD-10-CM | POA: Diagnosis not present

## 2023-10-29 DIAGNOSIS — F1020 Alcohol dependence, uncomplicated: Secondary | ICD-10-CM | POA: Diagnosis not present

## 2023-11-05 DIAGNOSIS — E039 Hypothyroidism, unspecified: Secondary | ICD-10-CM | POA: Diagnosis not present

## 2023-11-05 DIAGNOSIS — E785 Hyperlipidemia, unspecified: Secondary | ICD-10-CM | POA: Diagnosis not present

## 2023-11-05 DIAGNOSIS — D649 Anemia, unspecified: Secondary | ICD-10-CM | POA: Diagnosis not present

## 2023-11-05 DIAGNOSIS — R7989 Other specified abnormal findings of blood chemistry: Secondary | ICD-10-CM | POA: Diagnosis not present

## 2023-11-05 DIAGNOSIS — Z125 Encounter for screening for malignant neoplasm of prostate: Secondary | ICD-10-CM | POA: Diagnosis not present

## 2023-11-18 DIAGNOSIS — I10 Essential (primary) hypertension: Secondary | ICD-10-CM | POA: Diagnosis not present

## 2023-11-18 DIAGNOSIS — Z1339 Encounter for screening examination for other mental health and behavioral disorders: Secondary | ICD-10-CM | POA: Diagnosis not present

## 2023-11-18 DIAGNOSIS — Z0000 Encounter for general adult medical examination without abnormal findings: Secondary | ICD-10-CM | POA: Diagnosis not present

## 2023-11-18 DIAGNOSIS — Z1331 Encounter for screening for depression: Secondary | ICD-10-CM | POA: Diagnosis not present

## 2024-01-28 DIAGNOSIS — F1020 Alcohol dependence, uncomplicated: Secondary | ICD-10-CM | POA: Diagnosis not present

## 2024-01-28 DIAGNOSIS — F419 Anxiety disorder, unspecified: Secondary | ICD-10-CM | POA: Diagnosis not present

## 2024-01-28 DIAGNOSIS — F338 Other recurrent depressive disorders: Secondary | ICD-10-CM | POA: Diagnosis not present

## 2024-02-17 DIAGNOSIS — I10 Essential (primary) hypertension: Secondary | ICD-10-CM | POA: Diagnosis not present

## 2024-02-17 DIAGNOSIS — E039 Hypothyroidism, unspecified: Secondary | ICD-10-CM | POA: Diagnosis not present

## 2024-02-20 DIAGNOSIS — D696 Thrombocytopenia, unspecified: Secondary | ICD-10-CM | POA: Diagnosis not present

## 2024-02-20 DIAGNOSIS — K7460 Unspecified cirrhosis of liver: Secondary | ICD-10-CM | POA: Diagnosis not present

## 2024-04-02 DIAGNOSIS — R748 Abnormal levels of other serum enzymes: Secondary | ICD-10-CM | POA: Diagnosis not present

## 2024-04-02 DIAGNOSIS — F1091 Alcohol use, unspecified, in remission: Secondary | ICD-10-CM | POA: Diagnosis not present

## 2024-04-02 DIAGNOSIS — K766 Portal hypertension: Secondary | ICD-10-CM | POA: Diagnosis not present

## 2024-04-02 DIAGNOSIS — K7030 Alcoholic cirrhosis of liver without ascites: Secondary | ICD-10-CM | POA: Diagnosis not present

## 2024-04-02 DIAGNOSIS — R7401 Elevation of levels of liver transaminase levels: Secondary | ICD-10-CM | POA: Diagnosis not present

## 2024-04-17 DIAGNOSIS — F1091 Alcohol use, unspecified, in remission: Secondary | ICD-10-CM | POA: Diagnosis not present

## 2024-04-17 DIAGNOSIS — R748 Abnormal levels of other serum enzymes: Secondary | ICD-10-CM | POA: Diagnosis not present

## 2024-04-17 DIAGNOSIS — K7030 Alcoholic cirrhosis of liver without ascites: Secondary | ICD-10-CM | POA: Diagnosis not present

## 2024-04-17 DIAGNOSIS — K766 Portal hypertension: Secondary | ICD-10-CM | POA: Diagnosis not present

## 2024-05-01 DIAGNOSIS — F1020 Alcohol dependence, uncomplicated: Secondary | ICD-10-CM | POA: Diagnosis not present

## 2024-05-01 DIAGNOSIS — F419 Anxiety disorder, unspecified: Secondary | ICD-10-CM | POA: Diagnosis not present

## 2024-05-01 DIAGNOSIS — F338 Other recurrent depressive disorders: Secondary | ICD-10-CM | POA: Diagnosis not present

## 2024-07-02 ENCOUNTER — Encounter: Payer: Self-pay | Admitting: Nurse Practitioner

## 2024-08-05 DIAGNOSIS — F1020 Alcohol dependence, uncomplicated: Secondary | ICD-10-CM | POA: Diagnosis not present

## 2024-08-05 DIAGNOSIS — F419 Anxiety disorder, unspecified: Secondary | ICD-10-CM | POA: Diagnosis not present

## 2024-08-05 DIAGNOSIS — F338 Other recurrent depressive disorders: Secondary | ICD-10-CM | POA: Diagnosis not present

## 2024-09-14 ENCOUNTER — Other Ambulatory Visit: Payer: Self-pay | Admitting: Nurse Practitioner

## 2024-09-14 DIAGNOSIS — R748 Abnormal levels of other serum enzymes: Secondary | ICD-10-CM

## 2024-09-14 DIAGNOSIS — K7030 Alcoholic cirrhosis of liver without ascites: Secondary | ICD-10-CM

## 2024-09-14 DIAGNOSIS — F1091 Alcohol use, unspecified, in remission: Secondary | ICD-10-CM

## 2024-09-28 ENCOUNTER — Other Ambulatory Visit

## 2024-10-13 ENCOUNTER — Ambulatory Visit
Admission: RE | Admit: 2024-10-13 | Discharge: 2024-10-13 | Disposition: A | Source: Ambulatory Visit | Attending: Nurse Practitioner

## 2024-10-13 DIAGNOSIS — F1091 Alcohol use, unspecified, in remission: Secondary | ICD-10-CM

## 2024-10-13 DIAGNOSIS — R748 Abnormal levels of other serum enzymes: Secondary | ICD-10-CM

## 2024-10-13 DIAGNOSIS — K7030 Alcoholic cirrhosis of liver without ascites: Secondary | ICD-10-CM
